# Patient Record
Sex: Male | Born: 1985 | Race: White | Hispanic: No | Marital: Married | Smoking: Current some day smoker
Health system: Southern US, Community
[De-identification: ages and names within clinical notes are randomized; demographics above are authoritative.]

## PROBLEM LIST (undated history)

## (undated) DIAGNOSIS — G43909 Migraine, unspecified, not intractable, without status migrainosus: Secondary | ICD-10-CM

## (undated) HISTORY — PX: VASECTOMY: SHX75

---

## 2009-11-01 ENCOUNTER — Emergency Department: Payer: Self-pay | Admitting: Emergency Medicine

## 2009-11-02 ENCOUNTER — Emergency Department: Payer: Self-pay | Admitting: Emergency Medicine

## 2010-06-09 IMAGING — CR RIGHT HAND - COMPLETE 3+ VIEW
1 series · 3 of 3 positions shown · non-contrast
Comparison: none

REASON FOR EXAM: hand pain s/p punching wall
COMMENTS:

PROCEDURE:     DXR - DXR HAND RT COMPLETE W/OBLIQUES  - November 02, 2009  [DATE]
RESULT:     History: And pain, comparison: None

[Series 1: view not recorded · 0.17mm/px · 3 of 3 slices shown]
[im 1/3]
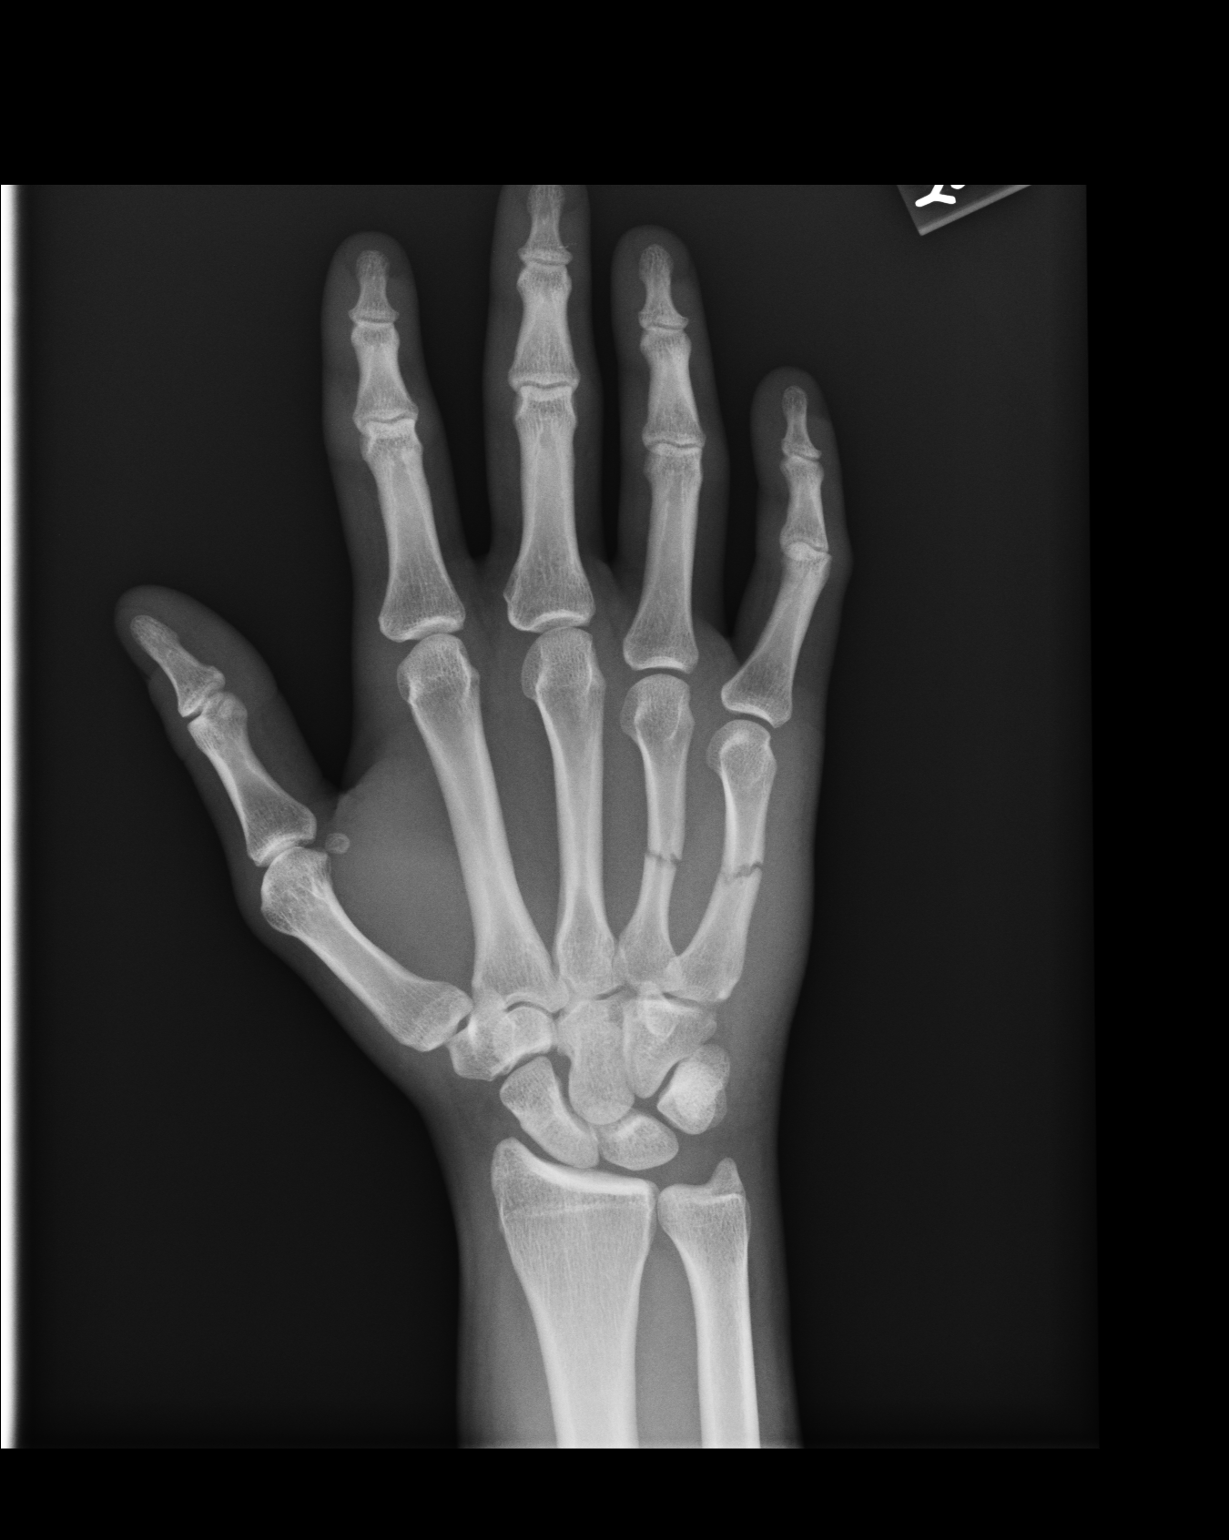
[im 2/3]
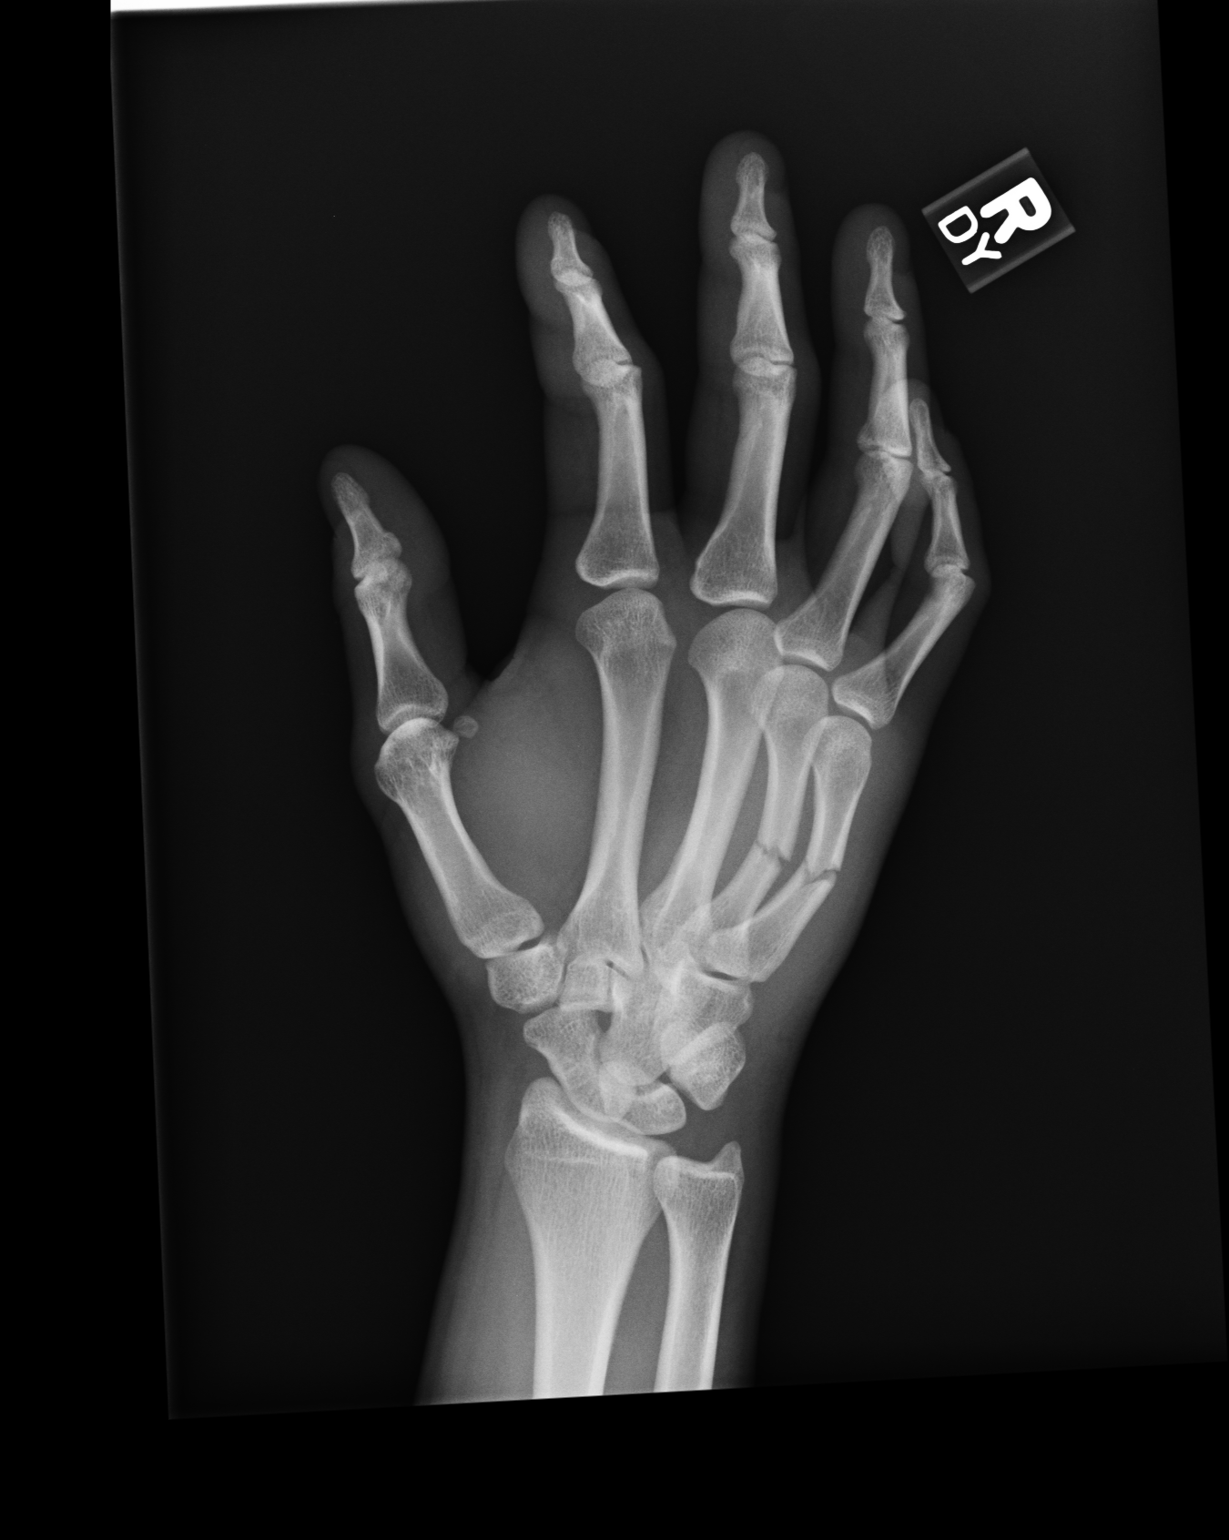
[im 3/3]
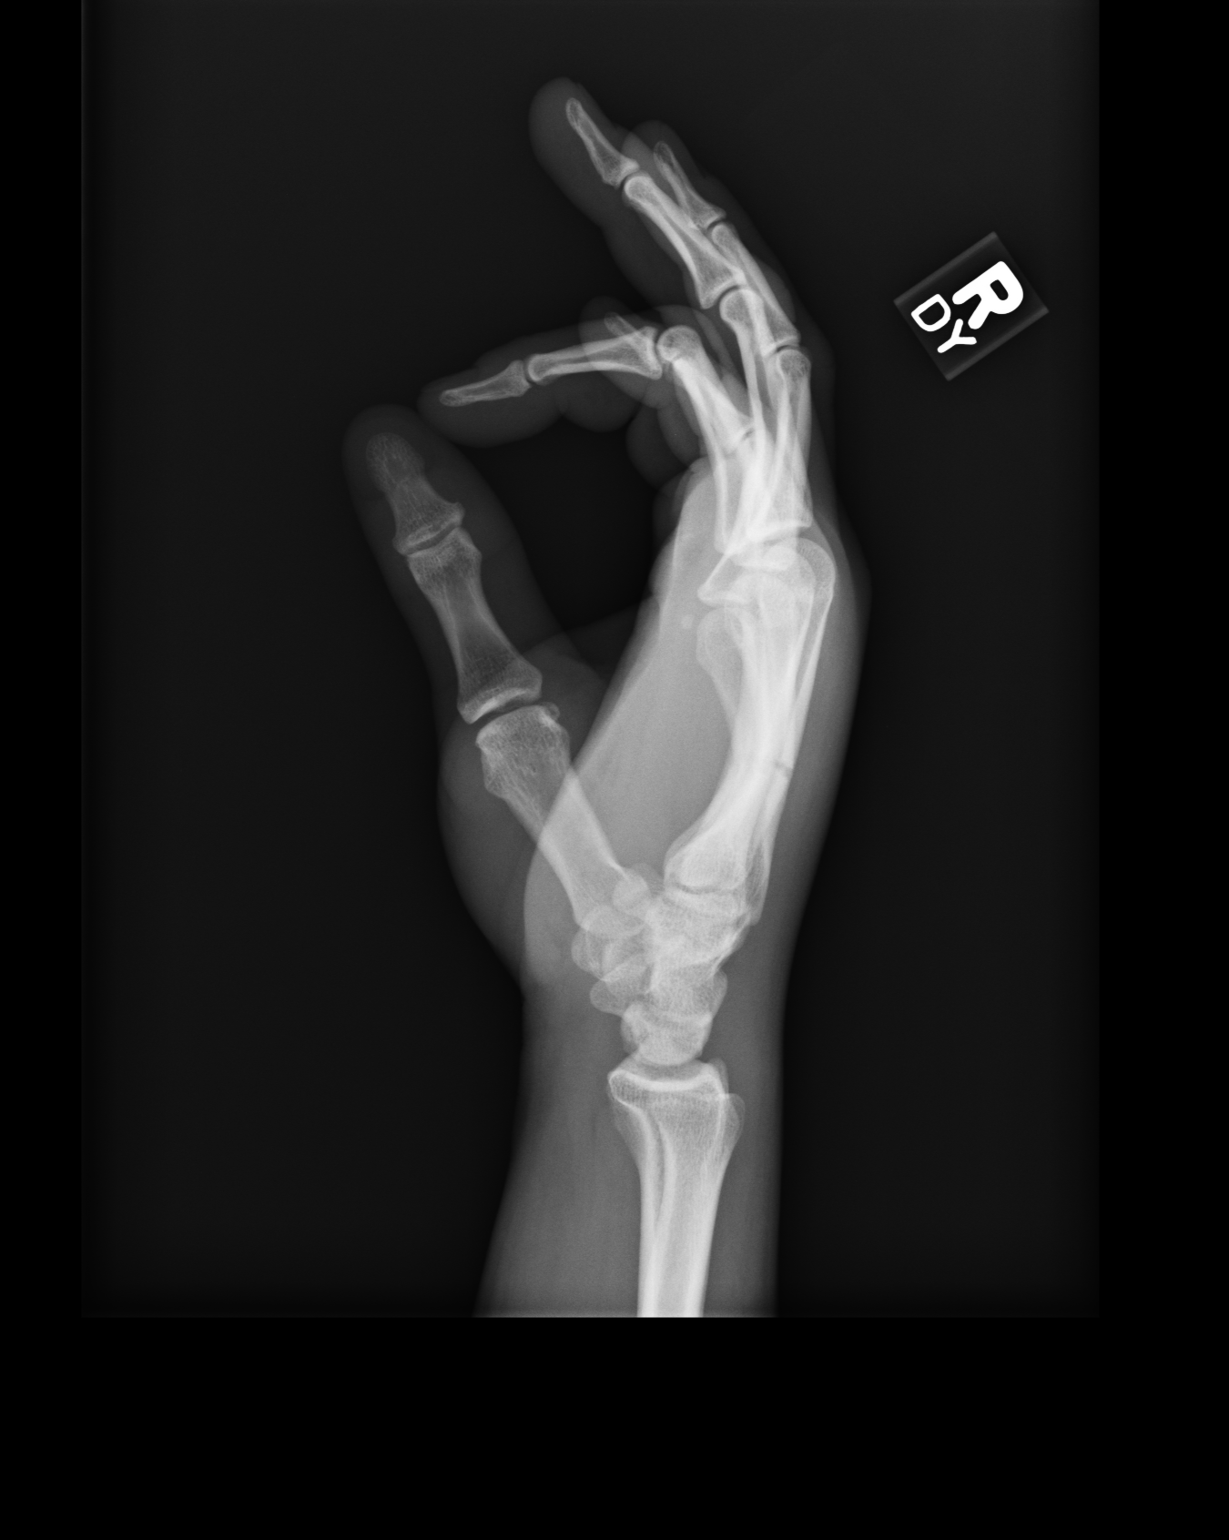

[3 of 3 positions shown; findings below may reference images not displayed]

FINDINGS: Three views of the right hand are provided. There is a transverse
nondisplaced fracture of the middiaphysis of the right fourth metacarpal.
There is a nondisplaced transverse fracture of the middiaphysis of the right
fifth metacarpal with mild apex dorsal angulation. No other fractures are
identified. There is no dislocation. There is mild overlying soft tissue
swelling.
IMPRESSION: 1. There is a transverse nondisplaced fracture of the middiaphysis of the
right fourth metacarpal.

2. There is a nondisplaced transverse fracture of the middiaphysis of the
right fifth metacarpal with mild apex dorsal angulation.

## 2011-03-12 ENCOUNTER — Emergency Department: Payer: Self-pay | Admitting: Emergency Medicine

## 2011-06-21 ENCOUNTER — Emergency Department: Payer: Self-pay | Admitting: Emergency Medicine

## 2011-08-09 ENCOUNTER — Emergency Department: Payer: Self-pay | Admitting: Emergency Medicine

## 2011-08-30 ENCOUNTER — Emergency Department: Payer: Self-pay | Admitting: Emergency Medicine

## 2011-10-06 ENCOUNTER — Emergency Department: Payer: Self-pay | Admitting: Internal Medicine

## 2012-01-26 IMAGING — CR DG SHOULDER 3+V*L*
1 series · 3 of 3 positions shown · non-contrast
Comparison: None

REASON FOR EXAM: pain
COMMENTS:

PROCEDURE:     DXR - DXR SHOULDER LEFT COMPLETE  - June 21, 2011  [DATE]
RESULT:     History: Pain

[Series 1: view not recorded · 0.17mm/px · 3 of 3 slices shown]
[im 1/3]
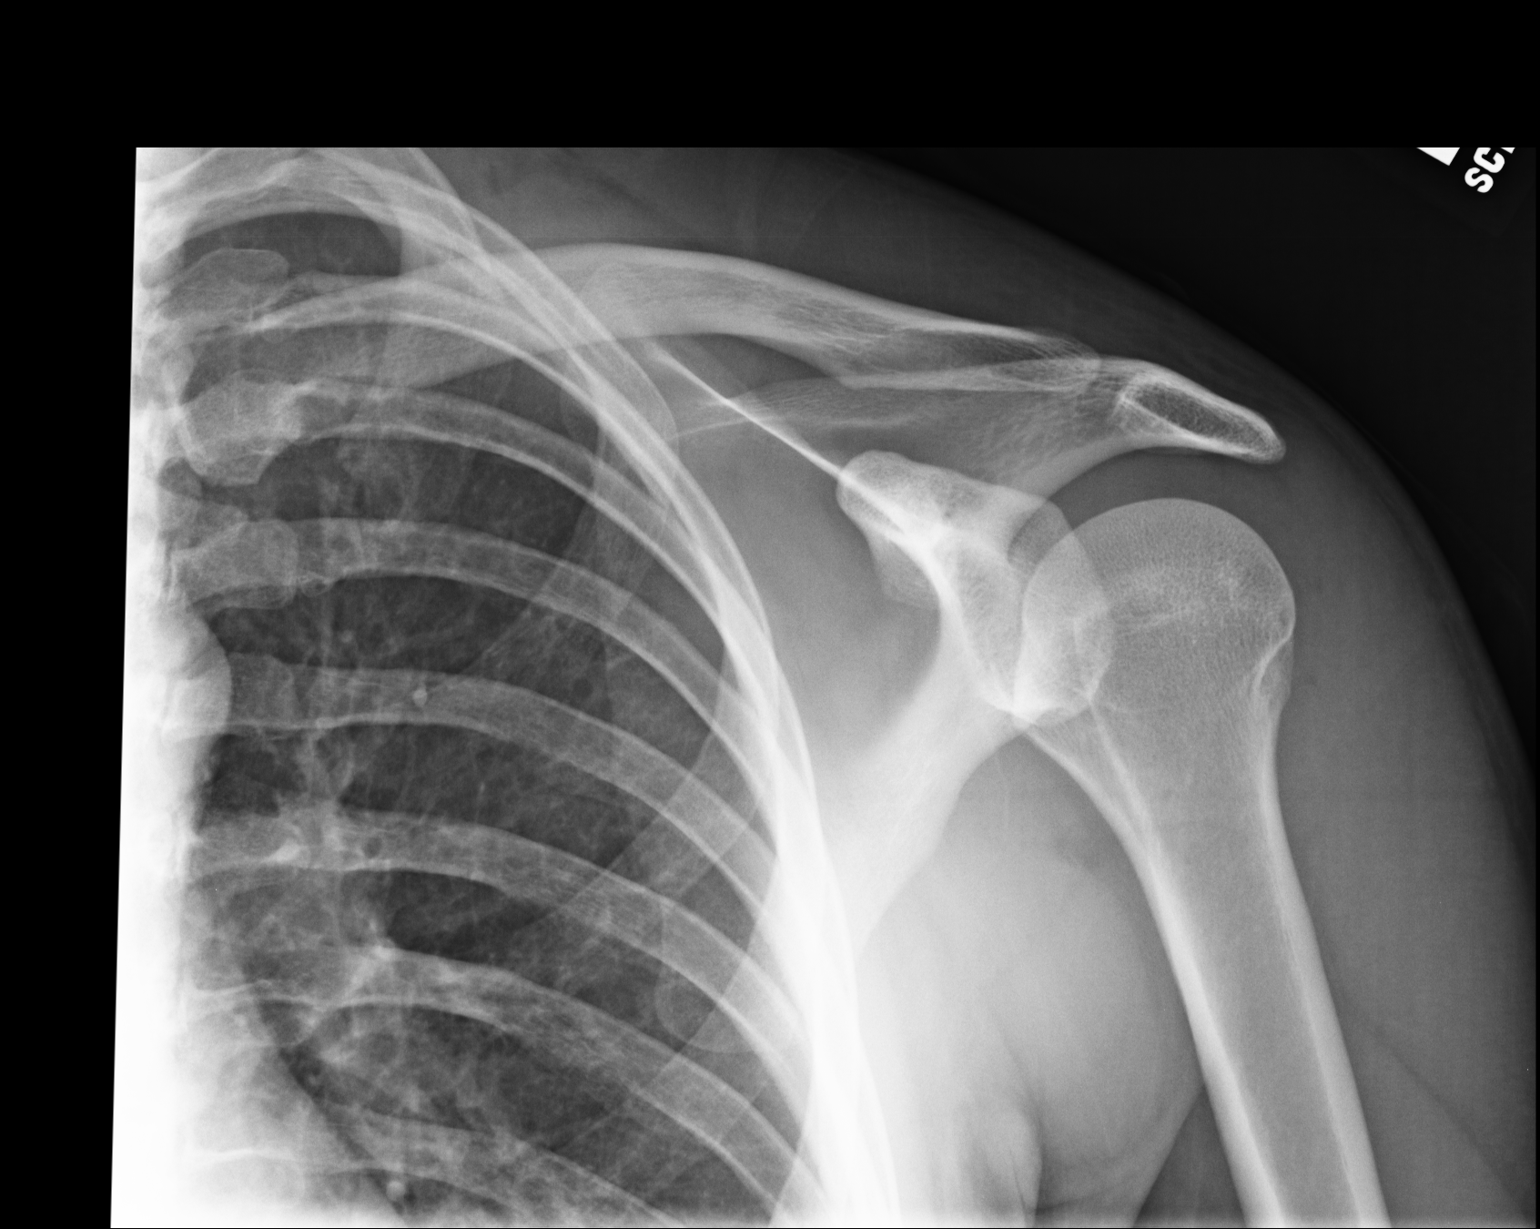
[im 2/3]
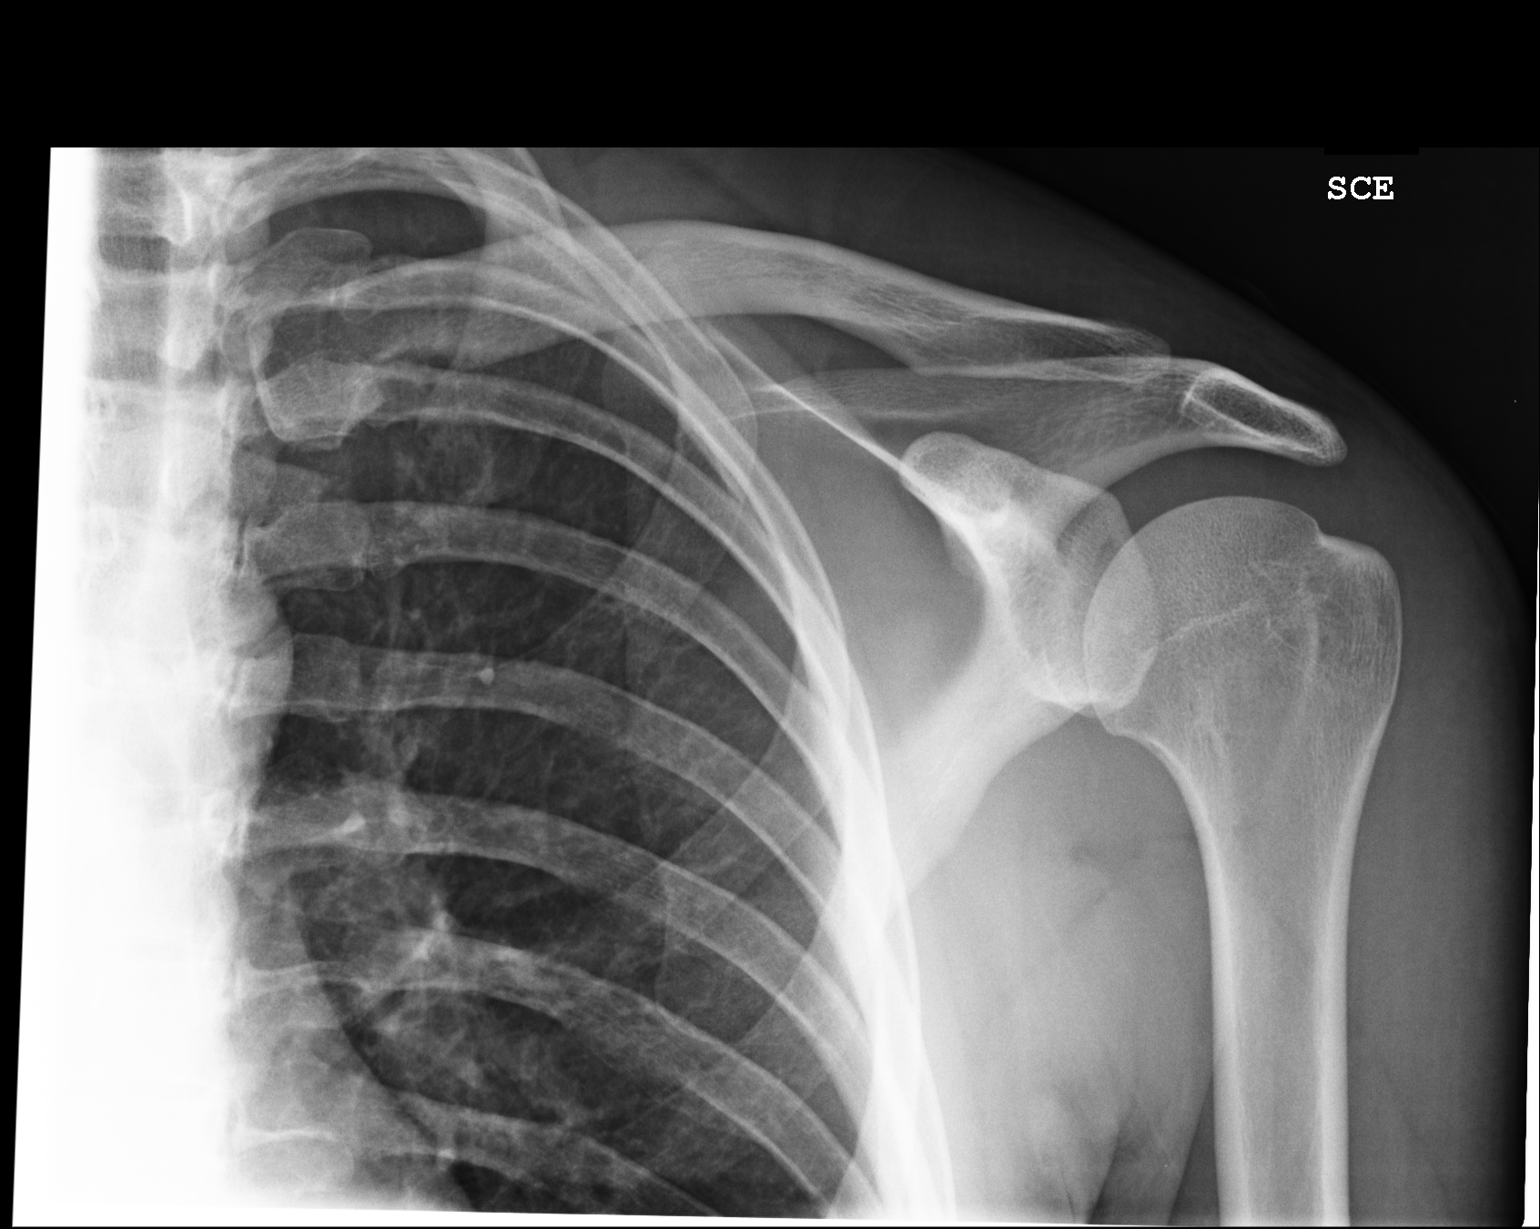
[im 3/3]
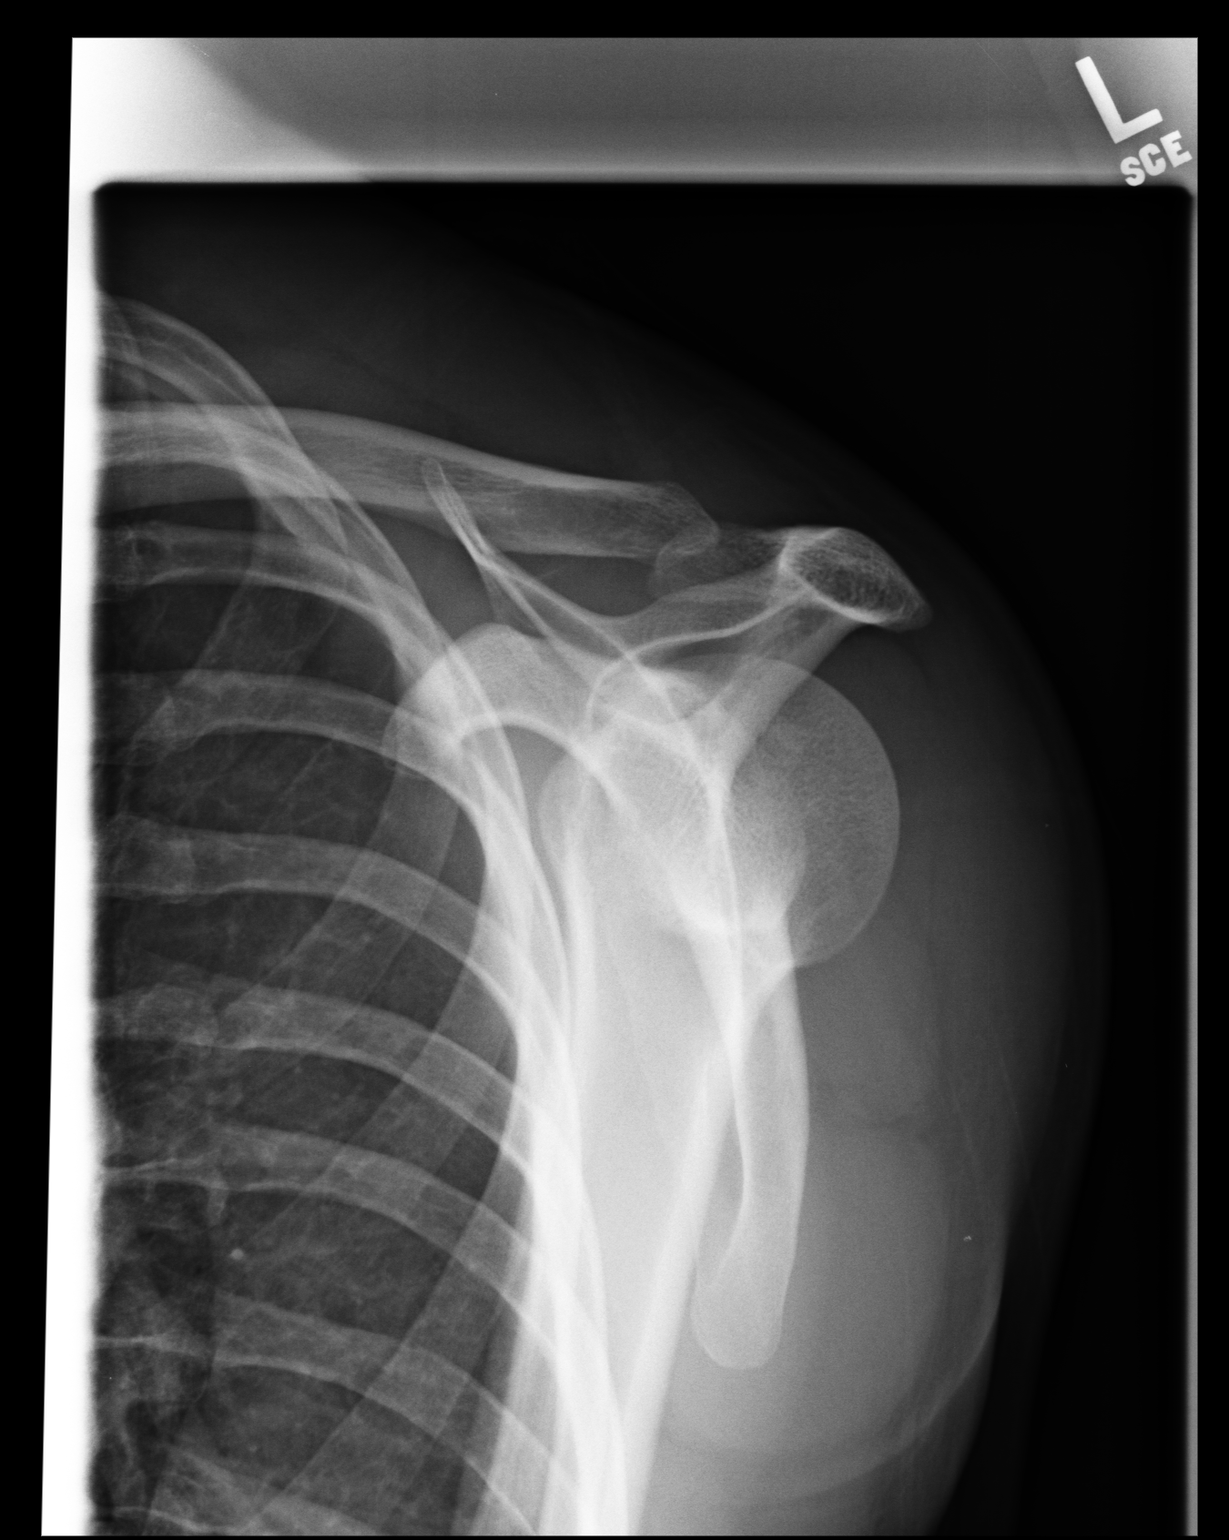

[3 of 3 positions shown; findings below may reference images not displayed]

FINDINGS: 3 views of the left shoulder demonstrates no fracture or dislocation. The
acromioclavicular joint is normal.
IMPRESSION: No acute osseous injury of the left shoulder.

## 2012-04-05 ENCOUNTER — Emergency Department: Payer: Self-pay | Admitting: Emergency Medicine

## 2013-01-08 ENCOUNTER — Emergency Department: Payer: Self-pay | Admitting: Emergency Medicine

## 2014-06-05 ENCOUNTER — Emergency Department: Payer: Self-pay | Admitting: Emergency Medicine

## 2014-06-05 LAB — CBC
HCT: 50.4 % (ref 40.0–52.0)
HGB: 17.1 g/dL (ref 13.0–18.0)
MCH: 31.7 pg (ref 26.0–34.0)
MCHC: 33.9 g/dL (ref 32.0–36.0)
MCV: 94 fL (ref 80–100)
PLATELETS: 202 10*3/uL (ref 150–440)
RBC: 5.39 10*6/uL (ref 4.40–5.90)
RDW: 12.1 % (ref 11.5–14.5)
WBC: 8.5 10*3/uL (ref 3.8–10.6)

## 2014-06-05 LAB — URINALYSIS, COMPLETE
BILIRUBIN, UR: NEGATIVE
Bacteria: NONE SEEN
Blood: NEGATIVE
Glucose,UR: NEGATIVE mg/dL (ref 0–75)
Hyaline Cast: 3
KETONE: NEGATIVE
Leukocyte Esterase: NEGATIVE
NITRITE: NEGATIVE
PH: 6 (ref 4.5–8.0)
Protein: 30
RBC,UR: 1 /HPF (ref 0–5)
SPECIFIC GRAVITY: 1.011 (ref 1.003–1.030)
Squamous Epithelial: NONE SEEN
WBC UR: 5 /HPF (ref 0–5)

## 2014-06-05 LAB — COMPREHENSIVE METABOLIC PANEL
ANION GAP: 6 — AB (ref 7–16)
Albumin: 4.1 g/dL (ref 3.4–5.0)
Alkaline Phosphatase: 85 U/L
BILIRUBIN TOTAL: 0.4 mg/dL (ref 0.2–1.0)
BUN: 12 mg/dL (ref 7–18)
CALCIUM: 8.9 mg/dL (ref 8.5–10.1)
CHLORIDE: 105 mmol/L (ref 98–107)
CREATININE: 1 mg/dL (ref 0.60–1.30)
Co2: 32 mmol/L (ref 21–32)
EGFR (African American): >60
Glucose: 106 mg/dL — ABNORMAL HIGH (ref 65–99)
Osmolality: 285 (ref 275–301)
Potassium: 3.8 mmol/L (ref 3.5–5.1)
SGOT(AST): 24 U/L (ref 15–37)
SGPT (ALT): 20 U/L
SODIUM: 143 mmol/L (ref 136–145)
TOTAL PROTEIN: 7.4 g/dL (ref 6.4–8.2)

## 2015-01-10 IMAGING — CT CT STONE STUDY
3 of 4 series · 5 of 16 positions shown, 6 images · non-contrast
Comparison: None.

CLINICAL DATA: Right flank pain and dysuria

EXAM:
CT ABDOMEN AND PELVIS WITHOUT CONTRAST
TECHNIQUE: Multidetector CT imaging of the abdomen and pelvis was performed
following the standard protocol without IV contrast.

[Series 4: lung · axial · 0.72mm/px · z∈[-636,-636]mm · 1 of 27 slices shown, 2 images]
[im 1/27  soft-tissue]
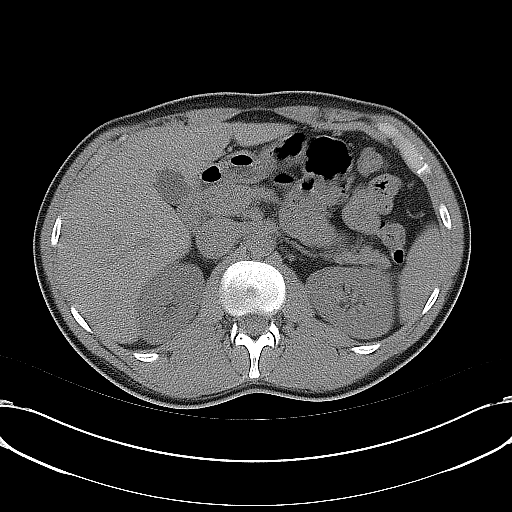
[im 1/27  bone]
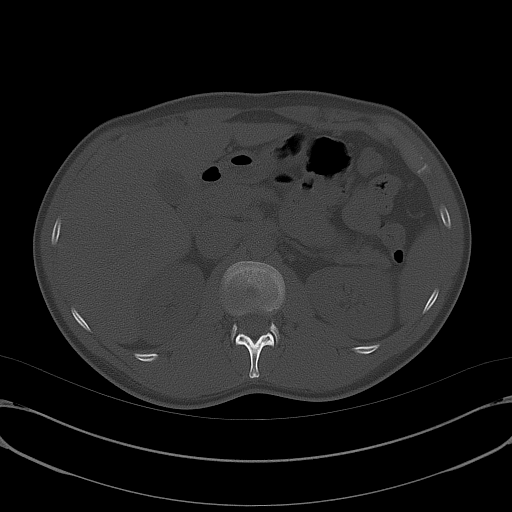

[Series 5: coronal · coronal · 0.81mm/px · 3 of 130 slices shown]
[im 33/130  soft-tissue]
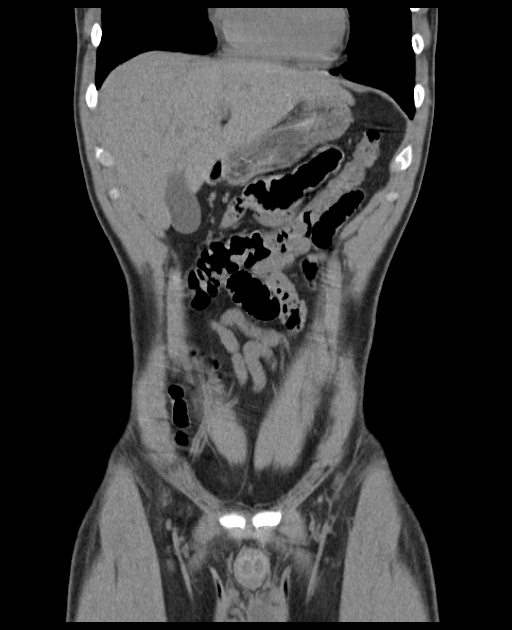
[im 65/130  soft-tissue]
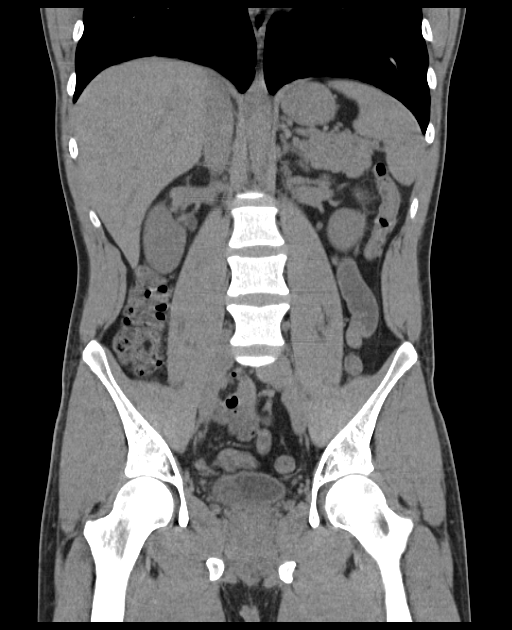
[im 97/130  soft-tissue]
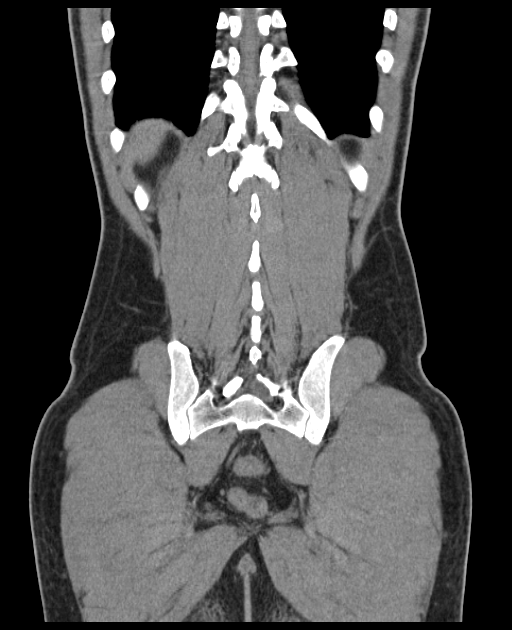

[Series 6: sagittal · sagittal · 0.90mm/px · 1 of 173 slices shown]
[im 69/173  soft-tissue]
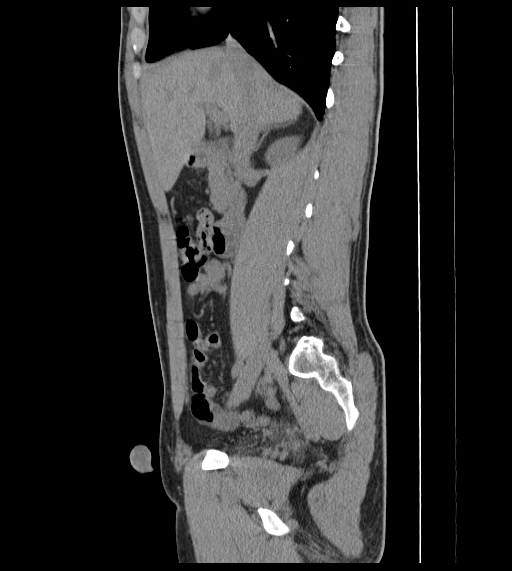

[5 of 16 positions shown; findings below may reference images not displayed]

FINDINGS: BODY WALL: Unremarkable.

LOWER CHEST: Unremarkable.

ABDOMEN/PELVIS:

Liver: No focal abnormality.

Biliary: No evidence of biliary obstruction or stone.

Pancreas: Unremarkable.

Spleen: Unremarkable.

Adrenals: Unremarkable.

Kidneys and ureters: 3 mm calculus at the right ureteral vesicular
junction. There is mild right hydronephrosis and hydroureter.
Probable developing calculus in the upper and lower poles of the
right kidney.

Bladder: Haziness in the right posterior bladder could reflect
submucosal edema or layering hemorrhage.

Reproductive: Unremarkable.

Bowel: No obstruction. Normal appendix.

Retroperitoneum: No mass or adenopathy.

Peritoneum: No ascites or pneumoperitoneum.

Vascular: No acute abnormality.

OSSEOUS: No acute abnormalities.
IMPRESSION: 3 mm stone at the right UVJ with mild urinary obstruction.

## 2015-12-27 ENCOUNTER — Emergency Department: Payer: Self-pay

## 2015-12-27 ENCOUNTER — Emergency Department
Admission: EM | Admit: 2015-12-27 | Discharge: 2015-12-27 | Disposition: A | Discharge status: Home / Self Care | Payer: Self-pay | Attending: Emergency Medicine | Admitting: Emergency Medicine

## 2015-12-27 DIAGNOSIS — Y9364 Activity, baseball: Secondary | ICD-10-CM | POA: Insufficient documentation

## 2015-12-27 DIAGNOSIS — Y92320 Baseball field as the place of occurrence of the external cause: Secondary | ICD-10-CM | POA: Insufficient documentation

## 2015-12-27 DIAGNOSIS — Y998 Other external cause status: Secondary | ICD-10-CM | POA: Insufficient documentation

## 2015-12-27 DIAGNOSIS — X58XXXA Exposure to other specified factors, initial encounter: Secondary | ICD-10-CM | POA: Insufficient documentation

## 2015-12-27 DIAGNOSIS — M2392 Unspecified internal derangement of left knee: Secondary | ICD-10-CM

## 2015-12-27 DIAGNOSIS — S83207A Unspecified tear of unspecified meniscus, current injury, left knee, initial encounter: Secondary | ICD-10-CM | POA: Insufficient documentation

## 2015-12-27 DIAGNOSIS — F17210 Nicotine dependence, cigarettes, uncomplicated: Secondary | ICD-10-CM | POA: Insufficient documentation

## 2015-12-27 MED ORDER — HYDROCODONE-ACETAMINOPHEN 5-325 MG PO TABS: 1.0000 | ORAL_TABLET | ORAL | Status: DC | PRN

## 2015-12-27 NOTE — ED Notes (Signed)
Discussed discharge instructions, prescriptions, and follow-up care with patient. No questions or concerns at this time. Pt stable at discharge.  

## 2015-12-27 NOTE — ED Provider Notes (Signed)
[facility] Emergency Department Provider Note ____________________________________________  Time seen: Approximately 3:05 PM  I have reviewed the triage vital signs and the nursing notes.   HISTORY  Chief Complaint Knee Injury   HPI Jerry Henson is a 30 y.o. male is here complaining of left knee pain. Patient injured his knee while jumping up for a baseball yesterday and came down with his leg straight and felt like his knee went backwards. He states that the pain became worse overnight and he has taken ibuprofen and Tylenol with some relief. He still continues to have swelling and decreased range of motion secondary to the swelling and pain. He denies any previous injury to his knee. Currently his pain is an 8 out of 10 however he does not want to take any pain medication at this time.   History reviewed. No pertinent past medical history.  There are no active problems to display for this patient.   History reviewed. No pertinent past surgical history.  Current Outpatient Rx  Name  Route  Sig  Dispense  Refill  . HYDROcodone-acetaminophen (NORCO/VICODIN) 5-325 MG tablet   Oral   Take 1 tablet by mouth every 4 (four) hours as needed for moderate pain.   20 tablet   0     Allergies Review of patient's allergies indicates no known allergies.  No family history on file.  Social History Social History  Substance Use Topics  . Smoking status: Current Every Day Smoker -- 1.00 packs/day    Types: Cigarettes  . Smokeless tobacco: None  . Alcohol Use: 1.2 oz/week    2 Cans of beer per week    Review of Systems Constitutional: No fever/chills ENT: No trauma Cardiovascular: Denies chest pain. Respiratory: Denies shortness of breath. Gastrointestinal:  No nausea, no vomiting.  Musculoskeletal: Positive left knee pain Skin: Negative for rash. Neurological: Negative for  focal weakness or numbness.  10-point ROS otherwise  negative.  ____________________________________________   PHYSICAL EXAM:  VITAL SIGNS: ED Triage Vitals  Enc Vitals Group     BP 12/27/15 1422 135/85 mmHg     Pulse Rate 12/27/15 1422 73     Resp 12/27/15 1422 18     Temp 12/27/15 1422 98.2 F (36.8 C)     Temp Source 12/27/15 1422 Oral     SpO2 12/27/15 1422 99 %     Weight 12/27/15 1422 185 lb (83.915 kg)     Height 12/27/15 1422 5\' 9"  (1.753 m)     Head Cir --      Peak Flow --      Pain Score 12/27/15 1423 8     Pain Loc --      Pain Edu? --      Excl. in GC? --     Constitutional: Alert and oriented. Well appearing and in no acute distress. Eyes: Conjunctivae are normal. PERRL. EOMI. Head: Atraumatic. Nose: No congestion/rhinnorhea. Neck: No stridor.   Cardiovascular: Normal rate, regular rhythm. Grossly normal heart sounds.  Good peripheral circulation. Respiratory: Normal respiratory effort.  No retractions. Lungs CTAB. Musculoskeletal: There is moderate tenderness on palpation of the anterior left knee. There is no gross deformity however there is some effusion present. Range of motion is restricted secondary to patient's pain and therefore ligaments weren't also not tested secondary to pain. Neurologic:  Normal speech and language. No gross focal neurologic deficits are appreciated. No gait instability. Skin:  Skin is warm, dry and intact. No erythema, abrasions, or ecchymosis  noted. Psychiatric: Mood and affect are normal. Speech and behavior are normal.  ____________________________________________   LABS (all labs ordered are listed, but only abnormal results are displayed)  Labs Reviewed - No data to display   RADIOLOGY  Left knee per radiologist shows moderate size joint effusion without definite fracture. I, Tommi Rumps, personally viewed and evaluated these images (plain radiographs) as part of my medical decision making, as well as reviewing the written report by the  radiologist. ____________________________________________   PROCEDURES  Procedure(s) performed: None  Critical Care performed: No  ____________________________________________   INITIAL IMPRESSION / ASSESSMENT AND PLAN / ED COURSE  Pertinent labs & imaging results that were available during my care of the patient were reviewed by me dered in my medical decision making (see chart for details).  Patient was discharged in knee immobilizer and crutches. He is also encouraged to ice and elevate his knee to reduce pain and swelling. He she will continue taking ibuprofen for pain and inflammation but was also given a prescription for Norco as needed for severe pain. He is encouraged to follow-up with Dr. Ernest Pine if any continued problems with his knee and a note for work was given as well. ____________________________________________   FINAL CLINICAL IMPRESSION(S) / ED DIAGNOSES  Final diagnoses:  Derangement of left knee      Tommi Rumps, PA-C 12/27/15 1713  Emily Filbert, MD 12/27/15 1721

## 2015-12-27 NOTE — ED Notes (Signed)
Pt reports to ED w/ c/o L knee injury.  Pt sts that he landed wrong after jumping for ball yesterday.  Pt sts injury/pain got worse overnight.  NAD.  Pulses and sensation intact

## 2016-03-21 ENCOUNTER — Encounter: Payer: Self-pay | Admitting: Emergency Medicine

## 2016-03-21 ENCOUNTER — Emergency Department
Admission: EM | Admit: 2016-03-21 | Discharge: 2016-03-21 | Disposition: A | Discharge status: Home / Self Care | Payer: Self-pay | Attending: Emergency Medicine | Admitting: Emergency Medicine

## 2016-03-21 DIAGNOSIS — G44219 Episodic tension-type headache, not intractable: Secondary | ICD-10-CM | POA: Insufficient documentation

## 2016-03-21 DIAGNOSIS — F17210 Nicotine dependence, cigarettes, uncomplicated: Secondary | ICD-10-CM | POA: Insufficient documentation

## 2016-03-21 HISTORY — DX: Migraine, unspecified, not intractable, without status migrainosus: G43.909

## 2016-03-21 LAB — URINALYSIS COMPLETE WITH MICROSCOPIC (ARMC ONLY)
BILIRUBIN URINE: NEGATIVE
Bacteria, UA: NONE SEEN
GLUCOSE, UA: NEGATIVE mg/dL
Hgb urine dipstick: NEGATIVE
KETONES UR: NEGATIVE mg/dL
Leukocytes, UA: NEGATIVE
Nitrite: NEGATIVE
PROTEIN: NEGATIVE mg/dL
RBC / HPF: NONE SEEN RBC/hpf (ref 0–5)
Specific Gravity, Urine: 1.01 (ref 1.005–1.030)
Squamous Epithelial / LPF: NONE SEEN
pH: 7 (ref 5.0–8.0)

## 2016-03-21 LAB — BASIC METABOLIC PANEL
Anion gap: 7 (ref 5–15)
BUN: 14 mg/dL (ref 6–20)
CALCIUM: 9.2 mg/dL (ref 8.9–10.3)
CHLORIDE: 103 mmol/L (ref 101–111)
CO2: 27 mmol/L (ref 22–32)
CREATININE: 0.92 mg/dL (ref 0.61–1.24)
GFR calc Af Amer: >60 mL/min (ref >60)
GFR calc non Af Amer: >60 mL/min (ref >60)
Glucose, Bld: 112 mg/dL — ABNORMAL HIGH (ref 65–99)
Potassium: 3.4 mmol/L — ABNORMAL LOW (ref 3.5–5.1)
SODIUM: 137 mmol/L (ref 135–145)

## 2016-03-21 LAB — CBC WITH DIFFERENTIAL/PLATELET
BASOS PCT: 1 %
Basophils Absolute: 0 10*3/uL (ref 0–0.1)
EOS ABS: 0.6 10*3/uL (ref 0–0.7)
EOS PCT: 7 %
HCT: 43.1 % (ref 40.0–52.0)
Hemoglobin: 14.9 g/dL (ref 13.0–18.0)
LYMPHS ABS: 2.3 10*3/uL (ref 1.0–3.6)
Lymphocytes Relative: 30 %
MCH: 31.3 pg (ref 26.0–34.0)
MCHC: 34.6 g/dL (ref 32.0–36.0)
MCV: 90.5 fL (ref 80.0–100.0)
MONOS PCT: 5 %
Monocytes Absolute: 0.4 10*3/uL (ref 0.2–1.0)
Neutro Abs: 4.5 10*3/uL (ref 1.4–6.5)
Neutrophils Relative %: 57 %
PLATELETS: 174 10*3/uL (ref 150–440)
RBC: 4.76 MIL/uL (ref 4.40–5.90)
RDW: 12.5 % (ref 11.5–14.5)
WBC: 7.8 10*3/uL (ref 3.8–10.6)

## 2016-03-21 MED ORDER — SODIUM CHLORIDE 0.9 % IV BOLUS (SEPSIS)
1000.0000 mL | Freq: Once | INTRAVENOUS | Status: AC
  Administered 2016-03-21: 1000 mL via INTRAVENOUS

## 2016-03-21 MED ORDER — TRAMADOL HCL 50 MG PO TABS
50.0000 mg | ORAL_TABLET | Freq: Once | ORAL | Status: AC
  Administered 2016-03-21: 50 mg via ORAL
  Filled 2016-03-21: qty 1

## 2016-03-21 MED ORDER — BUTALBITAL-APAP-CAFFEINE 50-325-40 MG PO TABS: 1.0000 | ORAL_TABLET | Freq: Four times a day (QID) | ORAL | Status: AC | PRN

## 2016-03-21 MED ORDER — KETOROLAC TROMETHAMINE 30 MG/ML IJ SOLN
30.0000 mg | Freq: Once | INTRAMUSCULAR | Status: AC
  Administered 2016-03-21: 30 mg via INTRAVENOUS
  Filled 2016-03-21: qty 1

## 2016-03-21 NOTE — ED Provider Notes (Signed)
[facility] Emergency Department Provider Note   ____________________________________________  Time seen: Approximately 4:32 PM  I have reviewed the triage vital signs and the nursing notes.   HISTORY  Chief Complaint Migraine    HPI Jerry Henson is a 30 y.o. male patient complaining of 1 month of intermittent headaches patient is a headaches, blurry vision and weakness. Patient denies any nausea vomiting associated these headaches. Patient state he does feel dehydrated. Patient states this trauma relieved by taking ibuprofen. Patient denies any visual or auditory aura  with these headaches but state they come on gradually.Patient also states that he removed to ticks in the past week from his left hip. Patient denies any rash from the tick bite denies any arthralgia. Patient denies any fever or chills associated with this complaint. No other palliative measures for this complaint. Patient denies this is the "worst headache of his life". Stated headache pain is the same with each occurrence. Past Medical History  Diagnosis Date  . Migraines     There are no active problems to display for this patient.   History reviewed. No pertinent past surgical history.  Current Outpatient Rx  Name  Route  Sig  Dispense  Refill  . butalbital-acetaminophen-caffeine (FIORICET) 50-325-40 MG tablet   Oral   Take 1-2 tablets by mouth every 6 (six) hours as needed for headache.   20 tablet   2   . HYDROcodone-acetaminophen (NORCO/VICODIN) 5-325 MG tablet   Oral   Take 1 tablet by mouth every 4 (four) hours as needed for moderate pain.   20 tablet   0     Allergies Review of patient's allergies indicates no known allergies.  No family history on file.  Social History Social History  Substance Use Topics  . Smoking status: Current Every Day Smoker -- 1.00 packs/day    Types: Cigarettes  . Smokeless tobacco: None  . Alcohol Use: 1.2 oz/week    2 Cans of  beer per week    Review of Systems Constitutional: No fever/chills Eyes:Intermittent blurry vision  ENT: No sore throat. Cardiovascular: Denies chest pain. Respiratory: Denies shortness of breath. Gastrointestinal: No abdominal pain.  No nausea, no vomiting.  No diarrhea.  No constipation. Genitourinary: Negative for dysuria. Musculoskeletal: Negative for back pain. Skin: Negative for rash. Neurological: Positive for headaches, denies focal weakness or numbness.   ____________________________________________   PHYSICAL EXAM:  VITAL SIGNS: ED Triage Vitals  Enc Vitals Group     BP 03/21/16 1606 134/80 mmHg     Pulse Rate 03/21/16 1606 59     Resp 03/21/16 1606 20     Temp 03/21/16 1606 98.4 F (36.9 C)     Temp Source 03/21/16 1606 Oral     SpO2 03/21/16 1606 100 %     Weight 03/21/16 1606 180 lb (81.647 kg)     Height 03/21/16 1606  (1.753 m)     Head Cir --      Peak Flow --      Pain Score 03/21/16 1608 8     Pain Loc --      Pain Edu? --      Excl. in GC? --     Constitutional: Alert and oriented. Well appearing and in no acute distress. Eyes: Conjunctivae are normal. PERRL. EOMI. Head: Atraumatic. Nose: No congestion/rhinnorhea. Mouth/Throat: Mucous membranes are moist.  Oropharynx non-erythematous. Neck: No stridor.  No cervical spine tenderness to palpation. Hematological/Lymphatic/Immunilogical: No cervical lymphadenopathy. Cardiovascular: Normal rate,  regular rhythm. Grossly normal heart sounds.  Good peripheral circulation.Bradycardic Respiratory: Normal respiratory effort.  No retractions. Lungs CTAB. Gastrointestinal: Soft and nontender. No distention. No abdominal bruits. No CVA tenderness. Musculoskeletal: No lower extremity tenderness nor edema.  No joint effusions. Neurologic:  Normal speech and language. No gross focal neurologic deficits are appreciated. No gait instability. Skin:  Skin is warm, dry and intact. No rash noted. Psychiatric:  Mood and affect are normal. Speech and behavior are normal.  ____________________________________________   LABS (all labs ordered are listed, but only abnormal results are displayed)  Labs Reviewed  BASIC METABOLIC PANEL - Abnormal; Notable for the following:    Potassium 3.4 (*)    Glucose, Bld 112 (*)    All other components within normal limits  URINALYSIS COMPLETEWITH MICROSCOPIC ([facility] ONLY) - Abnormal; Notable for the following:    Color, Urine YELLOW (*)    APPearance HAZY (*)    All other components within normal limits  CBC WITH DIFFERENTIAL/PLATELET   ____________________________________________  EKG   ____________________________________________  RADIOLOGY   ____________________________________________   PROCEDURES  Procedure(s) performed: None  Critical Care performed: No  ____________________________________________   INITIAL IMPRESSION / ASSESSMENT AND PLAN / ED COURSE  Pertinent labs & imaging results that were available during my care of the patient were reviewed by me and considered in my medical decision making (see chart for details).  Tension headache. Patient state headaches, 80% resolved. Patient given discharge Instructions. Patient advised to follow-up with "clinic if condition persists. ____________________________________________   FINAL CLINICAL IMPRESSION(S) / ED DIAGNOSES  Final diagnoses:  Episodic tension-type headache, not intractable      NEW MEDICATIONS STARTED DURING THIS VISIT:  New Prescriptions   BUTALBITAL-ACETAMINOPHEN-CAFFEINE (FIORICET) 50-325-40 MG TABLET    Take 1-2 tablets by mouth every 6 (six) hours as needed for headache.     Note:  This document was prepared using Dragon voice recognition software and may include unintentional dictation errors.    Joni Reiningonald K Chaos Carlile, PA-C 03/21/16 1816  Emily FilbertJonathan E Williams, MD 03/21/16 [phone]

## 2016-03-21 NOTE — Discharge Instructions (Signed)
Tension Headache A tension headache is pain, pressure, or aching that is felt over the front and sides of your head. These headaches can last from 30 minutes to several days. HOME CARE Managing Pain  Take over-the-counter and prescription medicines only as told by your doctor.  Lie down in a dark, quiet room when you have a headache.  If directed, apply ice to your head and neck area:  Put ice in a plastic bag.  Place a towel between your skin and the bag.  Leave the ice on for 20 minutes, 2-3 times per day.  Use a heating pad or a hot shower to apply heat to your head and neck area as told by your doctor. Eating and Drinking  Eat meals on a regular schedule.  Do not drink a lot of alcohol.  Do not use a lot of caffeine, or stop using caffeine. General Instructions  Keep all follow-up visits as told by your doctor. This is important.  Keep a journal to find out if certain things bring on headaches. For example, write down:  What you eat and drink.  How much sleep you get.  Any change to your diet or medicines.  Try getting a massage, or doing other things that help you to relax.  Lessen stress.  Sit up straight. Do not tighten (tense) your muscles.  Do not use tobacco products. This includes cigarettes, chewing tobacco, or e-cigarettes. If you need help quitting, ask your doctor.  Exercise regularly as told by your doctor.  Get enough sleep. This may mean 7-9 hours of sleep. GET HELP IF:  Your symptoms are not helped by medicine.  You have a headache that feels different from your usual headache.  You feel sick to your stomach (nauseous) or you throw up (vomit).  You have a fever. GET HELP RIGHT AWAY IF:  Your headache becomes very bad.  You keep throwing up.  You have a stiff neck.  You have trouble seeing.  You have trouble speaking.  You have pain in your eye or ear.  Your muscles are weak or you lose muscle control.  You lose your balance  or you have trouble walking.  You feel like you will pass out (faint) or you pass out.  You have confusion.   This information is not intended to replace advice given to you by your health care provider. Make sure you discuss any questions you have with your health care provider.   Document Released: 12/28/2009 Document Revised: 06/24/2015 Document Reviewed: 01/26/2015 Elsevier Interactive Patient Education 2016 [employer]  

## 2016-03-21 NOTE — ED Notes (Signed)
Pt reports he has been having an on and off headache for the past month that causes blurred vision and weakness. Pt denies  Nausea and vomiting but reports he feels as though he is dehydrated. Pt reports relief with ibuprophen but reports headache will return the next morning. Pt is able to walk independently and is in NAD at this time. No neuro deficits noted.

## 2016-03-21 NOTE — ED Notes (Signed)
Migraine headache started L sided. History of migraines. Also complains having removed 2 ticks in past week and worried about consequences.

## 2016-08-02 IMAGING — DX DG KNEE COMPLETE 4+V*L*
4 series · 4 of 4 positions shown · non-contrast
Comparison: None available.

CLINICAL DATA: Injury while playing softball yesterday. Unable to
extend knee. Initial encounter.

EXAM:
LEFT KNEE - COMPLETE 4+ VIEW

[knee ap]
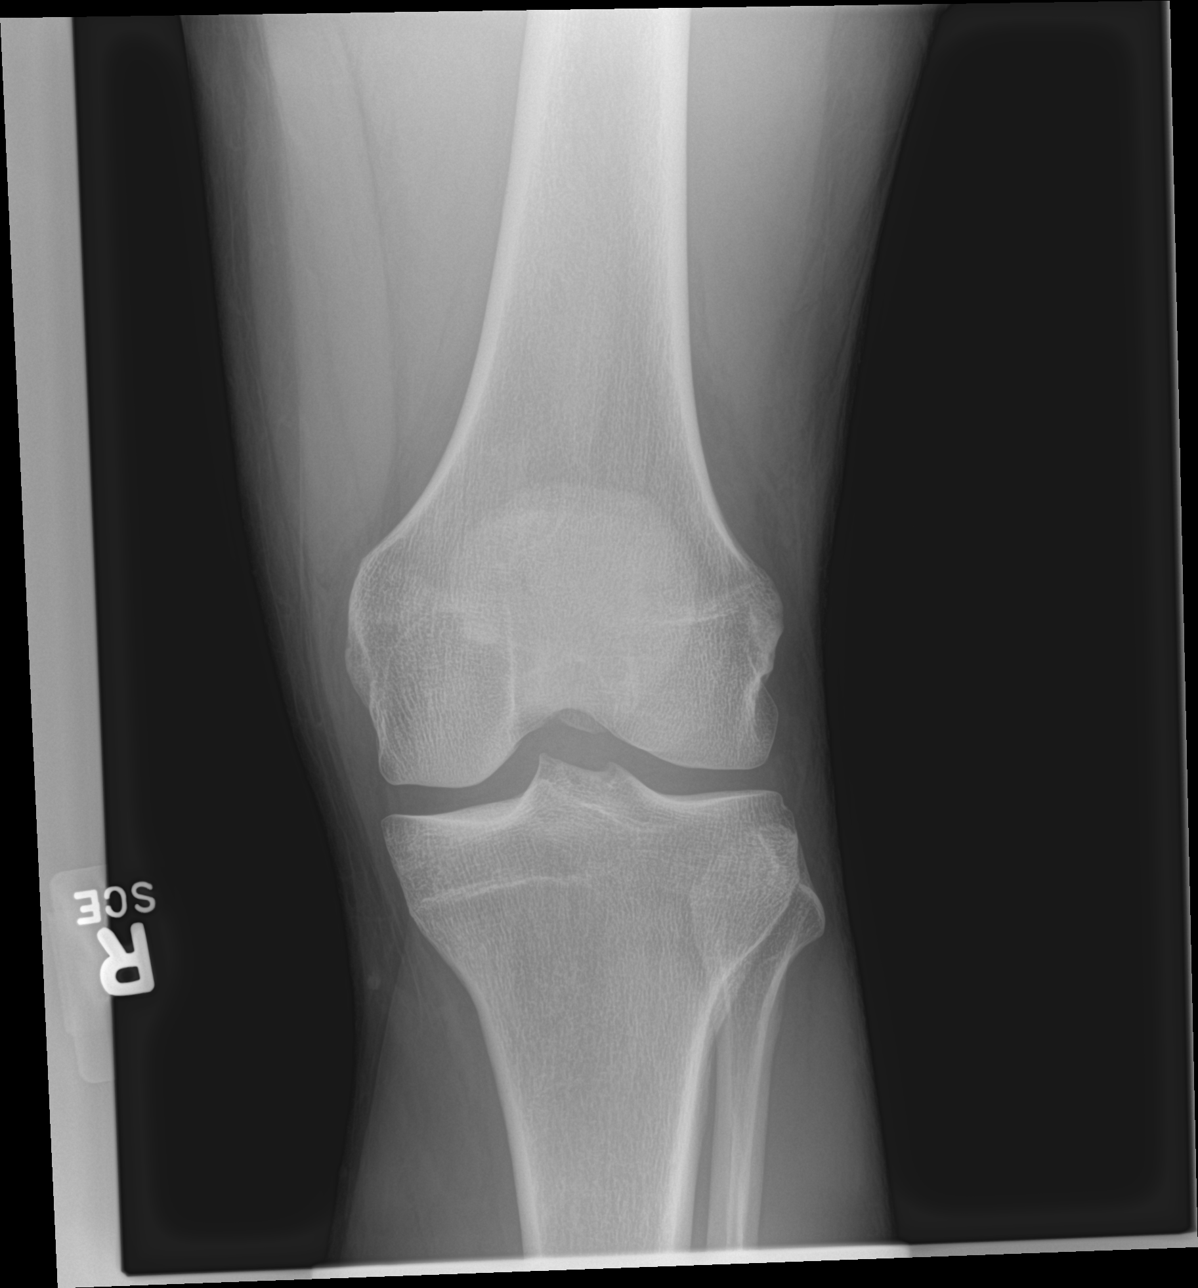

[knee tunnel]
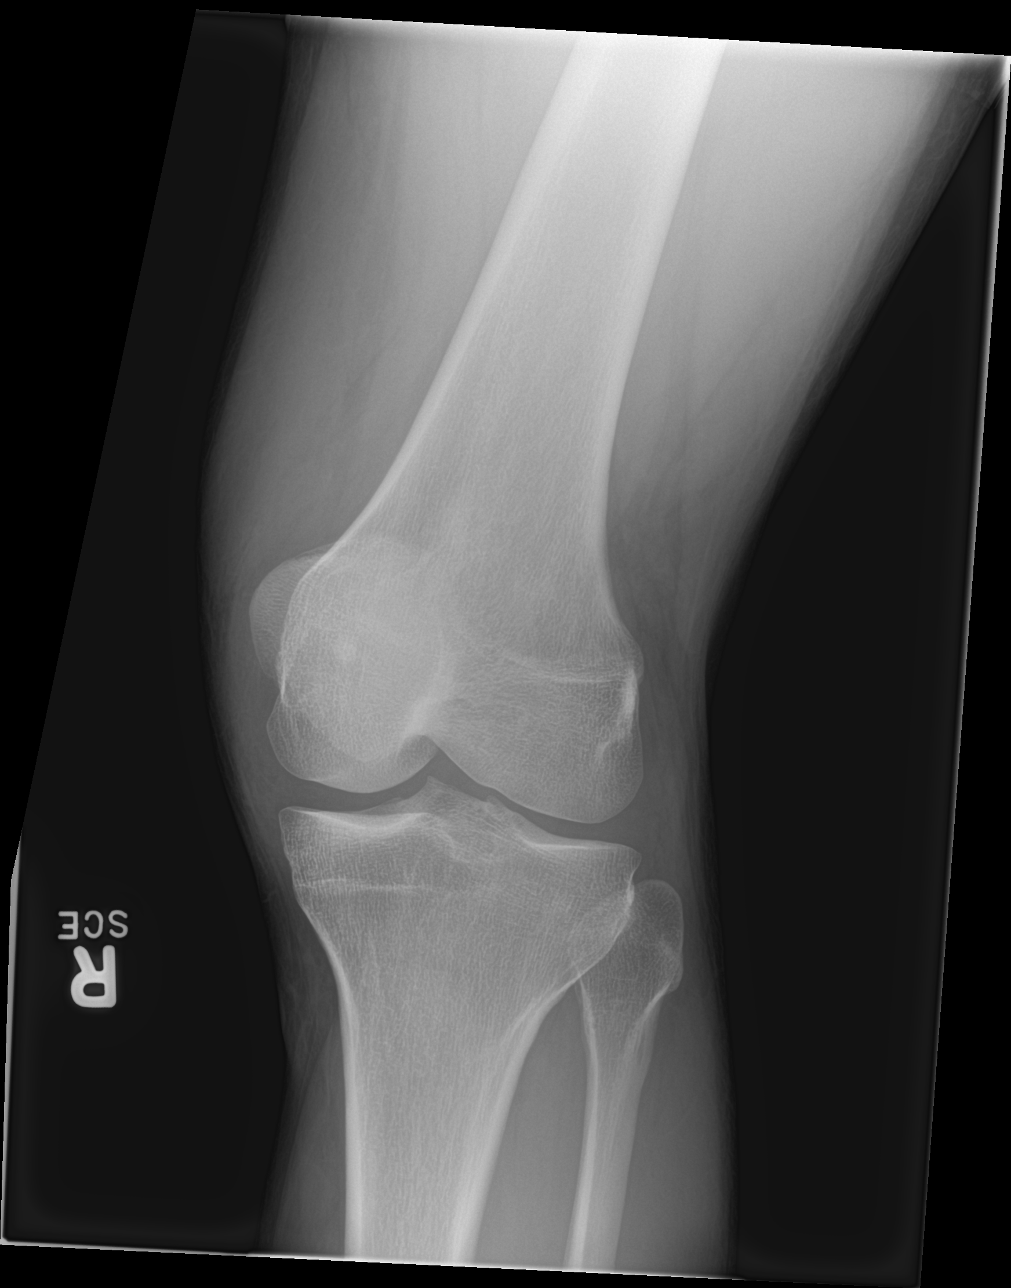

[knee lat]
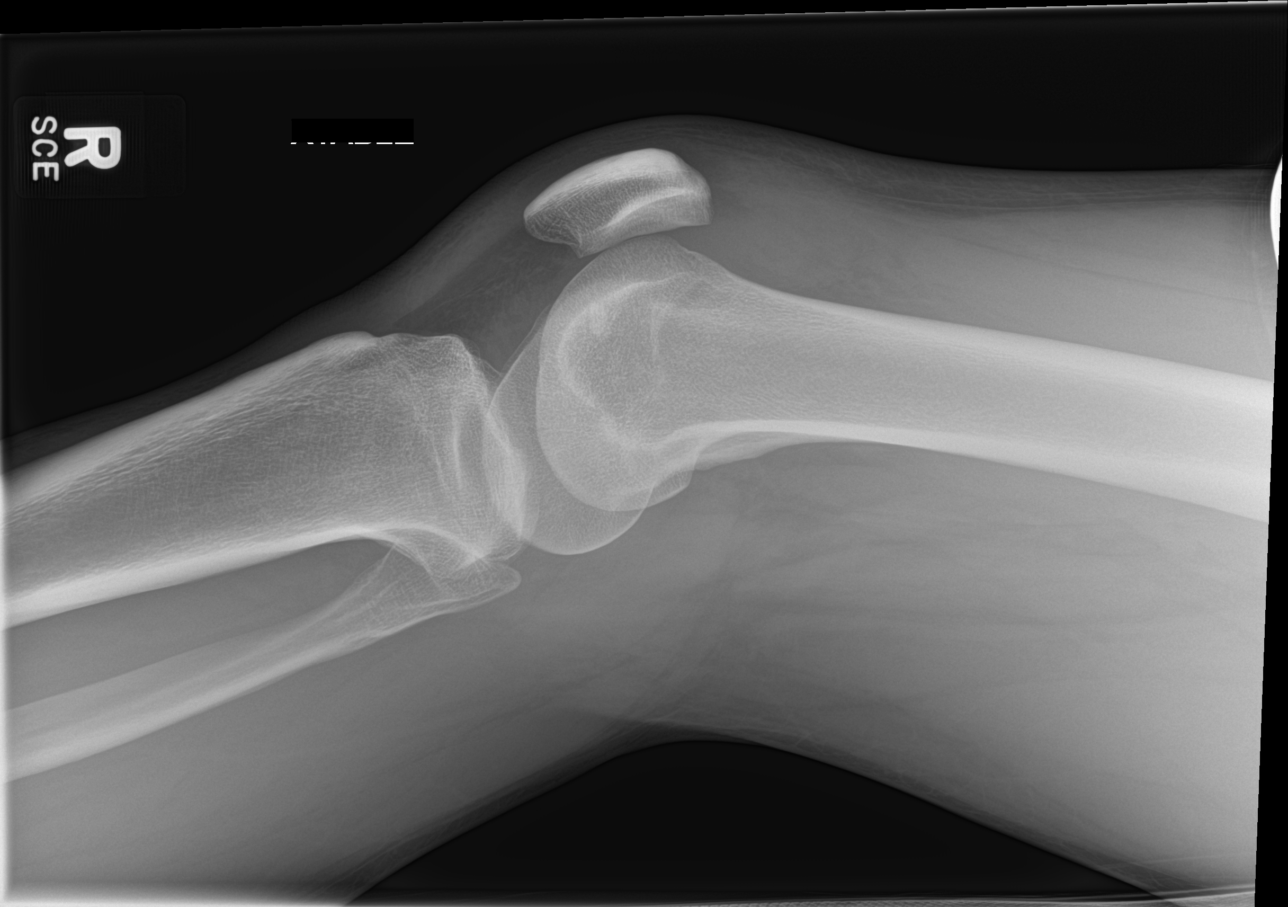

[knee obl]
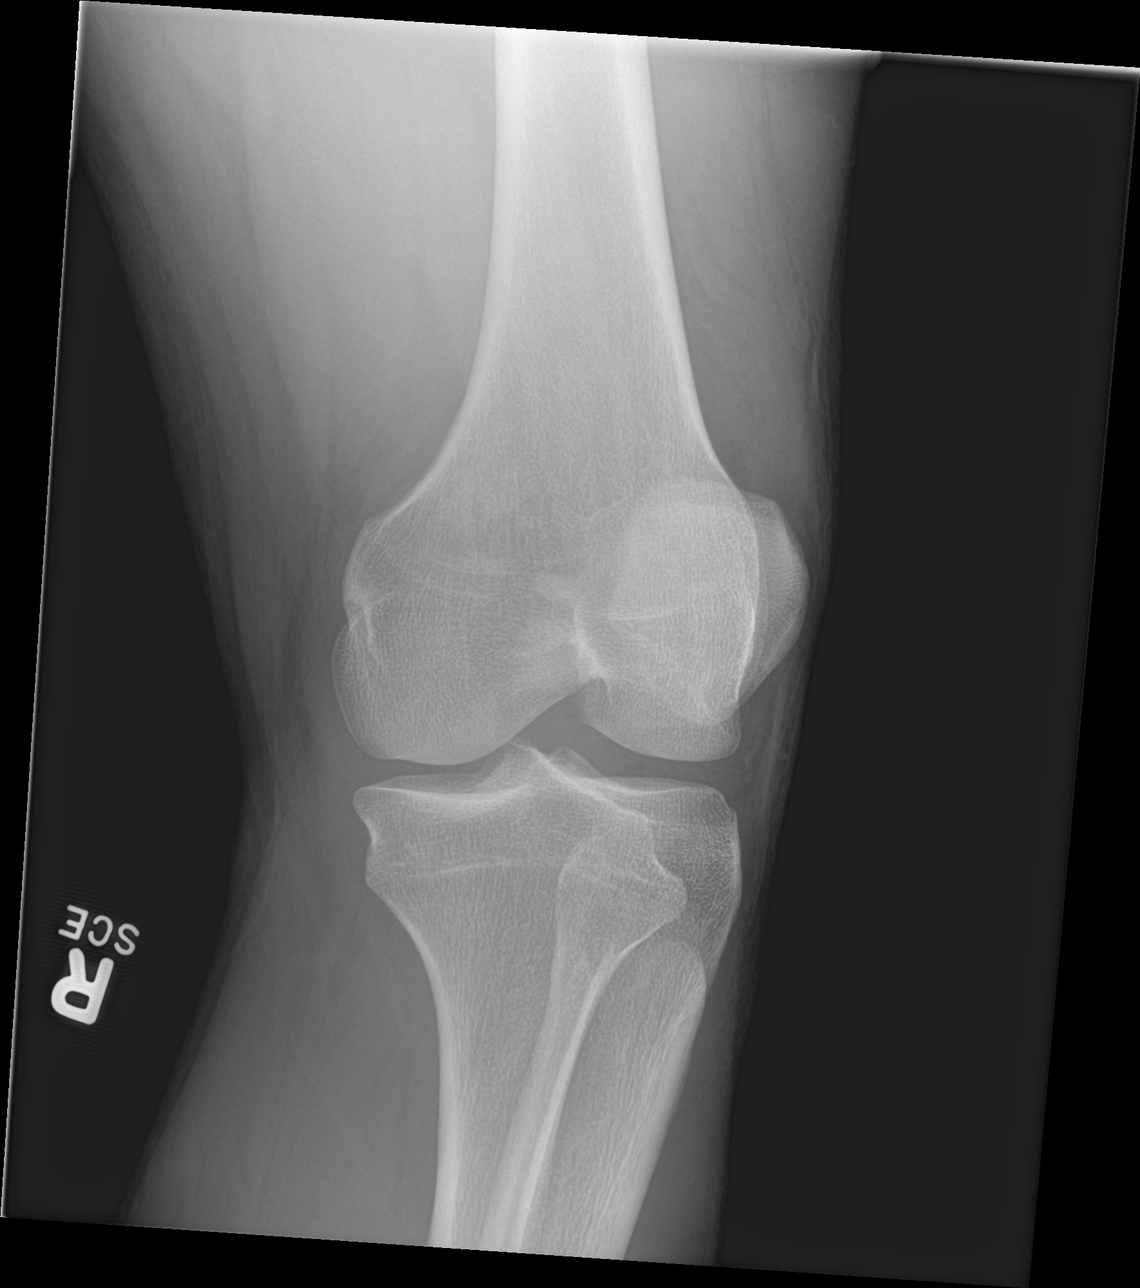

[4 of 4 positions shown; findings below may reference images not displayed]

FINDINGS: The right knee is located. A moderate-sized joint effusion is
present. There is no acute fracture. No foreign body is evident.
IMPRESSION: 1. Moderate-sized joint effusion without underlying fracture or
dislocation. Internal derangement is not excluded.

## 2017-07-18 ENCOUNTER — Encounter: Payer: Self-pay | Admitting: Urology

## 2017-07-18 ENCOUNTER — Ambulatory Visit (INDEPENDENT_AMBULATORY_CARE_PROVIDER_SITE_OTHER): Payer: Medicaid Other | Admitting: Urology

## 2017-07-18 VITALS — BP 124/71 | HR 67 | Ht 69.0 in | Wt 184.7 lb

## 2017-07-18 DIAGNOSIS — Z3009 Encounter for other general counseling and advice on contraception: Secondary | ICD-10-CM | POA: Diagnosis not present

## 2017-07-18 DIAGNOSIS — B356 Tinea cruris: Secondary | ICD-10-CM | POA: Diagnosis not present

## 2017-07-18 MED ORDER — DIAZEPAM 10 MG PO TABS: 10.0000 mg | ORAL_TABLET | Freq: Once | ORAL | 0 refills | Status: AC

## 2017-07-18 NOTE — Progress Notes (Signed)
07/18/2017 2:01 PM   Jerry Henson March 27, 1986 [id number]  Referring provider: Fernanda Drum, FNP No address on file  Chief Complaint  Patient presents with  . VAS Consult    HPI: He is here to consider vasectomy. He is engaged. He has three kids (age 32 yo, 47 yo, 40 yo). His fiancee has an 32 yo. They've considered vasectomy for 4 years. They've IUD for 4 years. He has had no pelvic pain or testicle or inguinal surgery.   Modifying factors: There are no other modifying factors  Associated signs and symptoms: There are no other associated signs and symptoms Aggravating and relieving factors: There are no other aggravating or relieving factors Severity: Moderate Duration: Persistent   PMH: Past Medical History:  Diagnosis Date  . Migraines     Surgical History: No past surgical history on file.  Home Medications:  Allergies as of 07/18/2017   No Known Allergies     Medication List       Accurate as of 07/18/17  2:01 PM. Always use your most recent med list.          diazepam 10 MG tablet Commonly known as:  VALIUM Take 1 tablet (10 mg total) by mouth once. Bring with you to vasectomy procedure   HYDROcodone-acetaminophen 5-325 MG tablet Commonly known as:  NORCO/VICODIN Take 1 tablet by mouth every 4 (four) hours as needed for moderate pain.       Allergies: No Known Allergies  Family History: Family History  Problem Relation Age of Onset  . Prostate cancer Neg Hx   . Bladder Cancer Neg Hx   . Kidney cancer Neg Hx     Social History:  reports that he has been smoking Cigarettes.  He has been smoking about 1.00 pack per day. He does not have any smokeless tobacco history on file. He reports that he drinks about 1.2 oz of alcohol per week . His drug history is not on file.  ROS: UROLOGY Frequent Urination?: No Hard to postpone urination?: No Burning/pain with urination?: No Get up at night to urinate?: No Leakage of urine?: No Urine  stream starts and stops?: No Trouble starting stream?: No Do you have to strain to urinate?: No Blood in urine?: No Urinary tract infection?: No Sexually transmitted disease?: No Injury to kidneys or bladder?: No Painful intercourse?: No Weak stream?: No Erection problems?: No Penile pain?: No  Gastrointestinal Nausea?: No Vomiting?: No Indigestion/heartburn?: No Diarrhea?: No Constipation?: No  Constitutional Fever: No Night sweats?: No Weight loss?: No Fatigue?: No  Skin Skin rash/lesions?: No Itching?: No  Eyes Blurred vision?: No Double vision?: No  Ears/Nose/Throat Sore throat?: No Sinus problems?: No  Hematologic/Lymphatic Swollen glands?: No Easy bruising?: No  Cardiovascular Leg swelling?: No Chest pain?: No  Respiratory Cough?: No Shortness of breath?: No  Endocrine Excessive thirst?: No  Musculoskeletal Back pain?: No Joint pain?: No  Neurological Headaches?: No Dizziness?: No  Psychologic Depression?: No Anxiety?: No  Physical Exam: There were no vitals taken for this visit.  Constitutional:  Alert and oriented, No acute distress. HEENT: [place] AT, moist mucus membranes.  Trachea midline, no masses. Cardiovascular: No clubbing, cyanosis, or edema. Respiratory: Normal respiratory effort, no increased work of breathing. GI: Abdomen is soft, nontender, nondistended, no abdominal masses GU: No CVA tenderness.  Penis:circumcised and normal.  Scrotum: testicles descended bilaterally and palpably normal; vas deferens palpably normal; tinea cruris above penis on SP area (erythematous ring lesions) Skin: No rashes, bruises  or suspicious lesions. Lymph: No cervical or inguinal adenopathy. Neurologic: Grossly intact, no focal deficits, moving all 4 extremities. Psychiatric: Normal mood and affect.  Laboratory Data: Lab Results  Component Value Date   WBC 7.8 03/21/2016   HGB 14.9 03/21/2016   HCT 43.1 03/21/2016   MCV 90.5 03/21/2016    PLT 174 03/21/2016    Lab Results  Component Value Date   CREATININE 0.92 03/21/2016    No results found for: PSA1  No results found for: TESTOSTERONE  No results found for: HGBA1C  Urinalysis Lab Results  Component Value Date   APPEARANCEUR HAZY (A) 03/21/2016   LEUKOCYTESUR NEGATIVE 03/21/2016   PROTEINUR NEGATIVE 03/21/2016   GLUCOSEU NEGATIVE 03/21/2016   RBCU NONE SEEN 03/21/2016   BILIRUBINUR NEGATIVE 03/21/2016   NITRITE NEGATIVE 03/21/2016    Lab Results  Component Value Date   BACTERIA NONE SEEN 03/21/2016   No results found for this or any previous visit. No results found for this or any previous visit. No results found for this or any previous visit. No results found for this or any previous visit. No results found for this or any previous visit. No results found for this or any previous visit. No results found for this or any previous visit. No results found for this or any previous visit.  Assessment & Plan:    1. Vasectomy evaluation I discussed with the patient normal male genitourinary anatomy and passage of sperm. We discussed the nature of vasectomy and that vasectomy is intended to be a permanent form of contraception. Although options do exist for fertility after vasectomy they are not always successful and can be quite expensive. We discussed vasectomy does not produce immediate sterility and another  form of contraception is required until a postvasectomy semen analysis confirms no sperm. We discussed this can take several weeks to months. We discussed that vasectomy is not 100% successful with the risk of pregnancy approximately 1 in 2000 following the procedure which may be due to late failure from rejoining of the vas ends.  Rarely repeat vasectomy is required. We discussed risks such as bleeding, infection, testicular atrophy, sperm granuloma, acute pain and permanent chronic scrotal pain among others. We discussed there are other permanent and  nonpermanent alternatives to vasectomy including male sterilization. We discussed postop care and that no heavy lifting, ejaculation or strenuous exercise is recommended for one week. All questions answered. I also discussed one of my colleagues may be doing the procedure.    No Follow-up on file.  Jerilee Field, MD  [facility] [address] [place], [place] [address] [phone]

## 2017-07-25 ENCOUNTER — Ambulatory Visit (INDEPENDENT_AMBULATORY_CARE_PROVIDER_SITE_OTHER): Payer: Self-pay | Admitting: Urology

## 2017-07-25 ENCOUNTER — Encounter: Payer: Self-pay | Admitting: Urology

## 2017-07-25 VITALS — BP 131/80 | HR 73 | Ht 69.0 in | Wt 178.5 lb

## 2017-07-25 DIAGNOSIS — Z3009 Encounter for other general counseling and advice on contraception: Secondary | ICD-10-CM

## 2017-07-25 MED ORDER — TRAMADOL HCL 50 MG PO TABS: 50.0000 mg | ORAL_TABLET | Freq: Four times a day (QID) | ORAL | 0 refills | Status: DC | PRN

## 2017-07-25 NOTE — Progress Notes (Signed)
07/25/17  CC:  Chief Complaint  Patient presents with  . VAS    HPI:  Blood pressure 131/80, pulse 73, height  (1.753 m), weight 178 lb 8 oz (81 kg). NED. A&Ox3.   No respiratory distress   Abd soft, NT, ND Normal external genitalia with patent urethral meatus   Bilateral Vasectomy Procedure  Pre-Procedure: - Patient's scrotum was prepped and draped for vasectomy. - The vas was palpated through the scrotal skin on the left. - 1% Xylocaine was injected into the skin and surrounding tissue for placement  - In a similar manner, the vas on the right was identified, anesthetized, and stabilized.  Procedure: - A bladeless technique was used to open the overlying skin (sharp dissection) - The left vas was isolated and brought up through the incision exposing that structure. - Bleeding points were cauterized as they occurred. - The vas was free from the surrounding structures and brought to the view. - A segment was positioned for placement with a hemostat. - A second hemostat was placed and a small segment between the two hemostats and was removed for inspection. - Each end of the transected vas lumen was fulgurated/ obliterated using needlepoint electrocautery -A fascial interposition was performed on testicular end of the vas using #3-0 chromic suture -The same procedure was performed on the right. - A single suture of #3-0 chromic catgut was used to close each lateral scrotal skin incision - A dressing was applied.  Post-Procedure: - Patient was instructed in care of the operative area - A specimen is to be delivered in 12 weeks   -Another form of contraception is to be used until post vasectomy semen analysis  HERRICK, Earle Gell, MD

## 2017-07-31 ENCOUNTER — Telehealth: Payer: Self-pay

## 2017-07-31 NOTE — Telephone Encounter (Signed)
Pt called stating he can feel a lump behind his testicles post vas. Pt denied n/v, f/c, tender to the touch, redness at incision site or where lump is. Pt stated that he is taking his ibuprofen/tylenol as recommended and otherwise is doing well post vas. Reinforced with pt to monitor the lump and if it grows, becomes tender to touch, testicles become more tender in general, heat presents or redness to call back. Pt voiced understanding of whole conversation.

## 2017-08-23 ENCOUNTER — Other Ambulatory Visit: Payer: Medicaid Other

## 2017-08-23 DIAGNOSIS — Z9852 Vasectomy status: Secondary | ICD-10-CM

## 2017-08-24 LAB — POST-VAS SPERM EVALUATION,QUAL: SEMEN VOLUME: 2.8 mL

## 2017-08-30 ENCOUNTER — Telehealth: Payer: Self-pay | Admitting: Urology

## 2017-08-30 NOTE — Telephone Encounter (Signed)
Pt called office asking results of his semen sample dropped off last week. States he hasn't had a call and is inquiring about his results. Please advise. Thanks.

## 2017-08-31 ENCOUNTER — Telehealth: Payer: Self-pay

## 2017-08-31 NOTE — Telephone Encounter (Signed)
Patient notified

## 2017-08-31 NOTE — Telephone Encounter (Signed)
Can you help me with this?

## 2017-08-31 NOTE — Telephone Encounter (Signed)
-----   Message from Crist FatBenjamin W Herrick, MD sent at 08/31/2017  7:44 AM EST ----- Please call patient and let him know that he is good to stop contraception.  Thanks.  BH ----- Message ----- From: Lissa HoardWatts, Sibel Khurana Michelle, CMA Sent: 08/25/2017   4:27 PM To: Crist FatBenjamin W Herrick, MD    ----- Message ----- From: Interface, [facility] Lab Results In Sent: 08/24/2017   1:38 PM To: Jennette K[facility]

## 2017-09-05 NOTE — Telephone Encounter (Signed)
Per Dr. Marlou PorchHerrick pt can stop birth control and does not need another specimen.

## 2018-05-27 ENCOUNTER — Emergency Department: Payer: Self-pay

## 2018-05-27 ENCOUNTER — Emergency Department
Admission: EM | Admit: 2018-05-27 | Discharge: 2018-05-27 | Disposition: A | Discharge status: Home / Self Care | Payer: Self-pay | Attending: Emergency Medicine | Admitting: Emergency Medicine

## 2018-05-27 ENCOUNTER — Encounter: Payer: Self-pay | Admitting: Emergency Medicine

## 2018-05-27 ENCOUNTER — Other Ambulatory Visit: Payer: Self-pay

## 2018-05-27 DIAGNOSIS — Y929 Unspecified place or not applicable: Secondary | ICD-10-CM | POA: Insufficient documentation

## 2018-05-27 DIAGNOSIS — F17210 Nicotine dependence, cigarettes, uncomplicated: Secondary | ICD-10-CM | POA: Insufficient documentation

## 2018-05-27 DIAGNOSIS — Y9389 Activity, other specified: Secondary | ICD-10-CM | POA: Insufficient documentation

## 2018-05-27 DIAGNOSIS — S60221A Contusion of right hand, initial encounter: Secondary | ICD-10-CM | POA: Insufficient documentation

## 2018-05-27 DIAGNOSIS — W231XXA Caught, crushed, jammed, or pinched between stationary objects, initial encounter: Secondary | ICD-10-CM | POA: Insufficient documentation

## 2018-05-27 DIAGNOSIS — Y998 Other external cause status: Secondary | ICD-10-CM | POA: Insufficient documentation

## 2018-05-27 MED ORDER — NABUMETONE 750 MG PO TABS: 750.0000 mg | ORAL_TABLET | Freq: Two times a day (BID) | ORAL | 0 refills | Status: DC
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
## 2018-05-27 NOTE — ED Notes (Signed)
Pt reports that he was chopping wood with his family member when his hand got stuck between the wood and his house. Right hand is red and swollen. Pt is 3/10 pain.

## 2018-05-27 NOTE — Discharge Instructions (Addendum)
Your have a hand contusion without evidence of fracture of dislocation. Apply ice to reduce swelling and warm epsom salt soaks for increased range of motion. Take the pain & inflammation medicine as directed. Follow-up with ortho if needed.
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
## 2018-05-29 ENCOUNTER — Emergency Department: Payer: Self-pay

## 2018-05-29 ENCOUNTER — Emergency Department
Admission: EM | Admit: 2018-05-29 | Discharge: 2018-05-29 | Disposition: A | Discharge status: Home / Self Care | Payer: Self-pay | Attending: Emergency Medicine | Admitting: Emergency Medicine

## 2018-05-29 ENCOUNTER — Encounter: Payer: Self-pay | Admitting: Emergency Medicine

## 2018-05-29 DIAGNOSIS — Y9389 Activity, other specified: Secondary | ICD-10-CM | POA: Insufficient documentation

## 2018-05-29 DIAGNOSIS — Y929 Unspecified place or not applicable: Secondary | ICD-10-CM | POA: Insufficient documentation

## 2018-05-29 DIAGNOSIS — F17210 Nicotine dependence, cigarettes, uncomplicated: Secondary | ICD-10-CM | POA: Insufficient documentation

## 2018-05-29 DIAGNOSIS — S60221A Contusion of right hand, initial encounter: Secondary | ICD-10-CM | POA: Insufficient documentation

## 2018-05-29 DIAGNOSIS — Y998 Other external cause status: Secondary | ICD-10-CM | POA: Insufficient documentation

## 2018-05-29 DIAGNOSIS — T148XXA Other injury of unspecified body region, initial encounter: Secondary | ICD-10-CM

## 2018-05-29 DIAGNOSIS — W208XXA Other cause of strike by thrown, projected or falling object, initial encounter: Secondary | ICD-10-CM | POA: Insufficient documentation

## 2018-05-29 DIAGNOSIS — Z23 Encounter for immunization: Secondary | ICD-10-CM | POA: Insufficient documentation

## 2018-05-29 DIAGNOSIS — L089 Local infection of the skin and subcutaneous tissue, unspecified: Secondary | ICD-10-CM | POA: Insufficient documentation

## 2018-05-29 MED ORDER — TETANUS-DIPHTH-ACELL PERTUSSIS 5-2.5-18.5 LF-MCG/0.5 IM SUSP
0.5000 mL | Freq: Once | INTRAMUSCULAR | Status: AC
  Administered 2018-05-29: 0.5 mL via INTRAMUSCULAR
  Filled 2018-05-29: qty 0.5

## 2018-05-29 MED ORDER — AMOXICILLIN-POT CLAVULANATE 875-125 MG PO TABS: 1.0000 | ORAL_TABLET | Freq: Two times a day (BID) | ORAL | 0 refills | Status: DC

## 2018-05-29 NOTE — Discharge Instructions (Addendum)
Follow-up with Dr. Ernest PineHooten if you are not better in 5 7 days.  Return emergency department if worsening.  Take the antibiotic as prescribed as you do have a wound infection.  If the redness spreads above the wrist you will need to return to the emergency department for IV antibiotics.

## 2018-05-29 NOTE — ED Triage Notes (Signed)
Presents with pain and swelling to right hand  States he was seen Sunday for initial injury  But today  Hit his hand again  Swelling is moving into wrist area   Good pulses
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
## 2018-07-07 ENCOUNTER — Emergency Department: Payer: Medicaid Other

## 2018-07-07 ENCOUNTER — Other Ambulatory Visit: Payer: Self-pay

## 2018-07-07 DIAGNOSIS — N452 Orchitis: Secondary | ICD-10-CM | POA: Insufficient documentation

## 2018-07-07 DIAGNOSIS — F17210 Nicotine dependence, cigarettes, uncomplicated: Secondary | ICD-10-CM | POA: Insufficient documentation

## 2018-07-07 DIAGNOSIS — Z79899 Other long term (current) drug therapy: Secondary | ICD-10-CM | POA: Insufficient documentation

## 2018-07-07 NOTE — ED Triage Notes (Signed)
Patient reports left testicle pain for past 2 hours.

## 2018-07-08 ENCOUNTER — Telehealth: Payer: Self-pay | Admitting: Urology

## 2018-07-08 ENCOUNTER — Emergency Department
Admission: EM | Admit: 2018-07-08 | Discharge: 2018-07-08 | Disposition: A | Discharge status: Home / Self Care | Payer: Medicaid Other | Attending: Emergency Medicine | Admitting: Emergency Medicine

## 2018-07-08 ENCOUNTER — Encounter: Payer: Self-pay | Admitting: Emergency Medicine

## 2018-07-08 ENCOUNTER — Emergency Department: Payer: Medicaid Other

## 2018-07-08 ENCOUNTER — Other Ambulatory Visit: Payer: Self-pay

## 2018-07-08 DIAGNOSIS — F17210 Nicotine dependence, cigarettes, uncomplicated: Secondary | ICD-10-CM | POA: Insufficient documentation

## 2018-07-08 DIAGNOSIS — N50819 Testicular pain, unspecified: Secondary | ICD-10-CM

## 2018-07-08 DIAGNOSIS — N452 Orchitis: Secondary | ICD-10-CM

## 2018-07-08 DIAGNOSIS — N50812 Left testicular pain: Secondary | ICD-10-CM | POA: Insufficient documentation

## 2018-07-08 DIAGNOSIS — Z79899 Other long term (current) drug therapy: Secondary | ICD-10-CM | POA: Insufficient documentation

## 2018-07-08 LAB — URINALYSIS, COMPLETE (UACMP) WITH MICROSCOPIC
Bacteria, UA: NONE SEEN
Bilirubin Urine: NEGATIVE
Glucose, UA: NEGATIVE mg/dL
HGB URINE DIPSTICK: NEGATIVE
Ketones, ur: NEGATIVE mg/dL
Leukocytes, UA: NEGATIVE
Nitrite: NEGATIVE
Protein, ur: NEGATIVE mg/dL
SPECIFIC GRAVITY, URINE: 1.01 (ref 1.005–1.030)
Squamous Epithelial / LPF: NONE SEEN (ref 0–5)
WBC UA: NONE SEEN WBC/hpf (ref 0–5)
pH: 7 (ref 5.0–8.0)

## 2018-07-08 LAB — CBC WITH DIFFERENTIAL/PLATELET
Basophils Absolute: 0 10*3/uL (ref 0–0.1)
Basophils Relative: 1 %
EOS ABS: 0.2 10*3/uL (ref 0–0.7)
EOS PCT: 4 %
HCT: 45.5 % (ref 40.0–52.0)
Hemoglobin: 16.1 g/dL (ref 13.0–18.0)
LYMPHS ABS: 1.5 10*3/uL (ref 1.0–3.6)
LYMPHS PCT: 25 %
MCH: 32.6 pg (ref 26.0–34.0)
MCHC: 35.3 g/dL (ref 32.0–36.0)
MCV: 92.3 fL (ref 80.0–100.0)
MONO ABS: 0.6 10*3/uL (ref 0.2–1.0)
MONOS PCT: 10 %
Neutro Abs: 3.7 10*3/uL (ref 1.4–6.5)
Neutrophils Relative %: 60 %
PLATELETS: 197 10*3/uL (ref 150–440)
RBC: 4.93 MIL/uL (ref 4.40–5.90)
RDW: 12.2 % (ref 11.5–14.5)
WBC: 6.1 10*3/uL (ref 3.8–10.6)

## 2018-07-08 LAB — URINALYSIS, ROUTINE W REFLEX MICROSCOPIC
Bilirubin Urine: NEGATIVE
Glucose, UA: NEGATIVE mg/dL
HGB URINE DIPSTICK: NEGATIVE
Ketones, ur: NEGATIVE mg/dL
Leukocytes, UA: NEGATIVE
Nitrite: NEGATIVE
Protein, ur: NEGATIVE mg/dL
SPECIFIC GRAVITY, URINE: 1.004 — AB (ref 1.005–1.030)
pH: 6 (ref 5.0–8.0)

## 2018-07-08 LAB — BASIC METABOLIC PANEL
Anion gap: 8 (ref 5–15)
BUN: 10 mg/dL (ref 6–20)
CO2: 28 mmol/L (ref 22–32)
CREATININE: 1.02 mg/dL (ref 0.61–1.24)
Calcium: 8.9 mg/dL (ref 8.9–10.3)
Chloride: 104 mmol/L (ref 98–111)
GFR calc Af Amer: >60 mL/min (ref >60)
Glucose, Bld: 103 mg/dL — ABNORMAL HIGH (ref 70–99)
POTASSIUM: 4.3 mmol/L (ref 3.5–5.1)
Sodium: 140 mmol/L (ref 135–145)

## 2018-07-08 MED ORDER — DOXYCYCLINE HYCLATE 100 MG PO CAPS: 100.0000 mg | ORAL_CAPSULE | Freq: Two times a day (BID) | ORAL | 0 refills | Status: DC

## 2018-07-08 MED ORDER — DOXYCYCLINE HYCLATE 100 MG PO TABS
100.0000 mg | ORAL_TABLET | Freq: Once | ORAL | Status: AC
  Administered 2018-07-08: 100 mg via ORAL
  Filled 2018-07-08: qty 1

## 2018-07-08 MED ORDER — OXYCODONE-ACETAMINOPHEN 5-325 MG PO TABS: 1.0000 | ORAL_TABLET | ORAL | 0 refills | Status: DC | PRN

## 2018-07-08 MED ORDER — CEFTRIAXONE SODIUM 250 MG IJ SOLR
250.0000 mg | Freq: Once | INTRAMUSCULAR | Status: AC
  Administered 2018-07-08: 250 mg via INTRAMUSCULAR
  Filled 2018-07-08: qty 250

## 2018-07-08 MED ORDER — KETOROLAC TROMETHAMINE 30 MG/ML IJ SOLN
30.0000 mg | Freq: Once | INTRAMUSCULAR | Status: AC
  Administered 2018-07-08: 30 mg via INTRAMUSCULAR
  Filled 2018-07-08: qty 1

## 2018-07-08 NOTE — ED Provider Notes (Signed)
[facility] Emergency Department Provider Note ____________________________________________   First MD Initiated Contact with Patient 07/08/18 0038     (approximate)  I have reviewed the triage vital signs and the nursing notes.   HISTORY  Chief Complaint Testicle Pain   HPI Jerry Henson is a 32 y.o. male with a history of vasectomy approximately 1 year ago as well as migraines and kidney stones was presenting the emergency department with left-sided testicular pain.  He states the pain started approximate 2 hours prior to arrival at about 8:30 PM.  He states the pain is severe when he moves.  Denies any swelling in his testicle.  Denies any burning with urination or back pain.  Says the pain started suddenly when he was sitting watching television.   Past Medical History:  Diagnosis Date  . Migraines     There are no active problems to display for this patient.   No past surgical history on file.  Prior to Admission medications   Medication Sig Start Date End Date Taking? Authorizing Provider  amoxicillin-clavulanate (AUGMENTIN) 875-125 MG tablet Take 1 tablet by mouth 2 (two) times daily. 05/29/18   Fisher, Roselyn Bering, PA-C  nabumetone (RELAFEN) 750 MG tablet Take 1 tablet (750 mg total) by mouth 2 (two) times daily. 05/27/18   Menshew, Charlesetta Ivory, PA-C    Allergies Pepto-bismol [bismuth subsalicylate]  Family History  Problem Relation Age of Onset  . Prostate cancer Neg Hx   . Bladder Cancer Neg Hx   . Kidney cancer Neg Hx     Social History Social History   Tobacco Use  . Smoking status: Current Every Day Smoker    Packs/day: 1.00    Types: Cigarettes  . Smokeless tobacco: Current User    Types: Chew  Substance Use Topics  . Alcohol use: Yes    Alcohol/week: 2.0 standard drinks    Types: 2 Cans of beer per week  . Drug use: Never    Review of Systems  Constitutional: No fever/chills Eyes: No visual changes. ENT: No sore  throat. Cardiovascular: Denies chest pain. Respiratory: Denies shortness of breath. Gastrointestinal: No abdominal pain.  No nausea, no vomiting.  No diarrhea.  No constipation. Genitourinary: As above  musculoskeletal: Negative for back pain. Skin: Negative for rash. Neurological: Negative for headaches, focal weakness or numbness.   ____________________________________________   PHYSICAL EXAM:  VITAL SIGNS: ED Triage Vitals  Enc Vitals Group     BP 07/07/18 2307 130/79     Pulse Rate 07/07/18 2307 66     Resp 07/07/18 2307 18     Temp 07/07/18 2307 98.5 F (36.9 C)     Temp Source 07/07/18 2307 Oral     SpO2 07/07/18 2307 97 %     Weight 07/07/18 2253 190 lb (86.2 kg)     Height 07/07/18 2253 5\' 9"  (1.753 m)     Head Circumference --      Peak Flow --      Pain Score 07/07/18 2253 9     Pain Loc --      Pain Edu? --      Excl. in GC? --     Constitutional: Alert and oriented. Well appearing and in no acute distress. Eyes: Conjunctivae are normal.  Head: Atraumatic. Nose: No congestion/rhinnorhea. Mouth/Throat: Mucous membranes are moist.  Neck: No stridor.   Cardiovascular: Normal rate, regular rhythm. Grossly normal heart sounds.   Respiratory: Normal respiratory effort.  No retractions.  Lungs CTAB. Gastrointestinal: Soft and nontender. No distention.  No CVA tenderness to palpation, bilaterally. Genitourinary: Left testicle is high riding and very tender to palpation.  No epididymal tenderness to palpation.  No penile tenderness.  Patient unable to tolerate open book technique of rotation for detorsion. Musculoskeletal: No lower extremity tenderness nor edema.  No joint effusions. Neurologic:  Normal speech and language. No gross focal neurologic deficits are appreciated. Skin:  Skin is warm, dry and intact. No rash noted. Psychiatric: Mood and affect are normal. Speech and behavior are normal.  ____________________________________________   LABS (all labs  ordered are listed, but only abnormal results are displayed)  Labs Reviewed  URINALYSIS, ROUTINE W REFLEX MICROSCOPIC - Abnormal; Notable for the following components:      Result Value   Color, Urine STRAW (*)    APPearance CLEAR (*)    Specific Gravity, Urine 1.004 (*)    All other components within normal limits   ____________________________________________  EKG   ____________________________________________  RADIOLOGY  Small bilateral hydroceles.  No evidence of torsion.  Testicular microlithiasis. ____________________________________________   PROCEDURES  Procedure(s) performed:   Procedures  Critical Care performed:   ____________________________________________   INITIAL IMPRESSION / ASSESSMENT AND PLAN / ED COURSE  Pertinent labs & imaging results that were available during my care of the patient were reviewed by me and considered in my medical decision making (see chart for details).  DDX: Testicular torsion, orchitis, epididymitis, testicular trauma As part of my medical decision making, I reviewed the following data within the electronic MEDICAL RECORD NUMBER Notes from prior ED visits  ----------------------------------------- 2:15 AM on 07/08/2018 -----------------------------------------  At this point we have sent out 2 pages to urology.  I concern for torsion especially because of the high riding left testicle.  However, we have not received a call back thus far from Dr. Lennox LaityEstridge.  ----------------------------------------- 3:54 AM on 07/08/2018 -----------------------------------------  After 2 pages in a call the office I was able to see with Dr. Mena GoesEskridge.  He reviewed the patient's imaging and states that it does not look like the patient has a torsion.  He says that he sees increased flow to the left testicle and patient to be treated for orchitis.  I discussed this with the patient who appears to be resting comfortably at this time.  He is  understanding of the treatment plan willing to comply but will be discharged home.  He knows that he must follow-up with urology. ____________________________________________   FINAL CLINICAL IMPRESSION(S) / ED DIAGNOSES  Final diagnoses:  Testicular pain  Orchitis.    NEW MEDICATIONS STARTED DURING THIS VISIT:  New Prescriptions   No medications on file     Note:  This document was prepared using Dragon voice recognition software and may include unintentional dictation errors.     Myrna BlazerSchaevitz, Aadon Gorelik Matthew, MD 07/08/18 [phone]

## 2018-07-08 NOTE — ED Provider Notes (Signed)
[facility] Emergency Department Provider Note  ____________________________________________   I have reviewed the triage vital signs and the nursing notes. Where available I have reviewed prior notes and, if possible and indicated, outside hospital notes.    HISTORY  Chief Complaint Testicle Pain    HPI Jerry Henson is a 32 y.o. male  Presents today complaining of ongoing testicular pain.  He was seen here yesterday had a thorough work-up by the emergency physician overnight.  He went home this morning between 4 and 5 AM it looks like.  He states that since going home he has had continued left testicular pain and it seemed to get a little worse when he walked around.  He talked to the urologist who recommended that he get rechecked.  Patient states that he has not yet taken his home antibiotics but he has had them filled.  He has taken some Motrin for the pain but not recently.  He denies any fever chills he has had no trauma.  The pain started yesterday, he has had no disruption of his bowel or bladder habits, he has had no dysuria urinary frequency there is no redness or swelling to the scrotum itself, he just states the pain is feeling significant and he wants to make sure nothing is changed As a sharp tolerating left testicular pain worse when he walks around  Past Medical History:  Diagnosis Date  . Migraines     There are no active problems to display for this patient.   History reviewed. No pertinent surgical history.  Prior to Admission medications   Medication Sig Start Date End Date Taking? Authorizing Provider  amoxicillin-clavulanate (AUGMENTIN) 875-125 MG tablet Take 1 tablet by mouth 2 (two) times daily. 05/29/18   Fisher, Roselyn Bering, PA-C  doxycycline (VIBRAMYCIN) 100 MG capsule Take 1 capsule (100 mg total) by mouth 2 (two) times daily. 07/08/18   Schaevitz, Myra Rude, MD  nabumetone (RELAFEN) 750 MG tablet Take 1 tablet (750 mg total) by  mouth 2 (two) times daily. 05/27/18   Menshew, Charlesetta Ivory, PA-C    Allergies Pepto-bismol [bismuth subsalicylate]  Family History  Problem Relation Age of Onset  . Prostate cancer Neg Hx   . Bladder Cancer Neg Hx   . Kidney cancer Neg Hx     Social History Social History   Tobacco Use  . Smoking status: Current Every Day Smoker    Packs/day: 1.00    Types: Cigarettes  . Smokeless tobacco: Current User    Types: Chew  Substance Use Topics  . Alcohol use: Yes    Alcohol/week: 2.0 standard drinks    Types: 2 Cans of beer per week  . Drug use: Never    Review of Systems Constitutional: No fever/chills Eyes: No visual changes. ENT: No sore throat. No stiff neck no neck pain Cardiovascular: Denies chest pain. Respiratory: Denies shortness of breath. Gastrointestinal:   no vomiting.  No diarrhea.  No constipation. Genitourinary: Negative for dysuria. Musculoskeletal: Negative lower extremity swelling Skin: Negative for rash. Neurological: Negative for severe headaches, focal weakness or numbness.   ____________________________________________   PHYSICAL EXAM:  VITAL SIGNS: ED Triage Vitals  Enc Vitals Group     BP 07/08/18 1539 (!) 141/81     Pulse Rate 07/08/18 1539 76     Resp 07/08/18 1539 18     Temp 07/08/18 1539 98.3 F (36.8 C)     Temp Source 07/08/18 1539 Oral     SpO2  07/08/18 1539 98 %     Weight 07/08/18 1539 190 lb (86.2 kg)     Height 07/08/18 1539 5\' 9"  (1.753 m)     Head Circumference --      Peak Flow --      Pain Score 07/08/18 1543 8     Pain Loc --      Pain Edu? --      Excl. in GC? --     Constitutional: Alert and oriented. Well appearing and in no acute distress. Eyes: Conjunctivae are normal Head: Atraumatic HEENT: No congestion/rhinnorhea. Mucous membranes are moist.  Oropharynx non-erythematous Neck:   Nontender with no meningismus, no masses, no stridor Cardiovascular: Normal rate, regular rhythm. Grossly normal heart  sounds.  Good peripheral circulation. Respiratory: Normal respiratory effort.  No retractions. Lungs CTAB. Abdominal: Soft and nontender. No distention. No guarding no rebound Back:  There is no focal tenderness or step off.  there is no midline tenderness there are no lesions noted. there is no CVA tenderness Penis is normal in appearance, there is no swelling there is no discharge, the scrotum is normal appearance with normal cremasteric reflexes no erythema or induration, testicles normal to palpation with no tenderness or swelling, left testicle feels mildly swollen, however there is no significant edema, it is tender to palpation.  There is no mass, there is no herniation noted in the inguinal canal. Musculoskeletal: No lower extremity tenderness, no upper extremity tenderness. No joint effusions, no DVT signs strong distal pulses no edema Neurologic:  Normal speech and language. No gross focal neurologic deficits are appreciated.  Skin:  Skin is warm, dry and intact. No rash noted. Psychiatric: Mood and affect are normal. Speech and behavior are normal.  ____________________________________________   LABS (all labs ordered are listed, but only abnormal results are displayed)  Labs Reviewed  BASIC METABOLIC PANEL - Abnormal; Notable for the following components:      Result Value   Glucose, Bld 103 (*)    All other components within normal limits  URINALYSIS, COMPLETE (UACMP) WITH MICROSCOPIC - Abnormal; Notable for the following components:   Color, Urine YELLOW (*)    APPearance CLEAR (*)    All other components within normal limits  CBC WITH DIFFERENTIAL/PLATELET    Pertinent labs  results that were available during my care of the patient were reviewed by me and considered in my medical decision making (see chart for details). ____________________________________________  EKG  I personally interpreted any EKGs ordered by me or  triage  ____________________________________________  RADIOLOGY  Pertinent labs & imaging results that were available during my care of the patient were reviewed by me and considered in my medical decision making (see chart for details). If possible, patient and/or family made aware of any abnormal findings.  US Scrotum  Result Date: 07/08/2018 CLINICAL DATA:  Left testicular pain. EXAM: SCROTAL ULTRASOUND DOPPLER ULTRASOUND OF THE TESTICLES TECHNIQUE: Complete ultrasound examination of the testicles, epididymis, and other scrotal structures was performed. Color and spectral Doppler ultrasound were also utilized to evaluate blood flow to the testicles. COMPARISON:  Scrotal ultrasound performed yesterday FINDINGS: Right testicle Measurements: 4.3 x 2.0 x 2.6 cm. Few scattered microcalcifications. No mass. Left testicle Measurements: 4.2 x 2.0 x 2.8 cm. The previously seen microcalcifications not well visualized on today's study. No mass. Right epididymis:  Small cysts, the largest 2-3 mm. Left epididymis:  Small cyst measuring 4 mm Hydrocele:  Small right hydrocele Varicocele:  Mild left varicocele. Pulsed Doppler interrogation  of both testes demonstrates normal low resistance arterial and venous waveforms bilaterally. IMPRESSION: No testicular abnormality. Small bilateral epididymal cysts. Small right hydrocele. Mild left varicocele. Electronically Signed   By: Charlett NoseKevin  Dover M.D.   On: 07/08/2018 16:50   [place] Scrotum  Result Date: 07/07/2018 CLINICAL DATA:  Left testicular pain EXAM: SCROTAL ULTRASOUND DOPPLER ULTRASOUND OF THE TESTICLES TECHNIQUE: Complete ultrasound examination of the testicles, epididymis, and other scrotal structures was performed. Color and spectral Doppler ultrasound were also utilized to evaluate blood flow to the testicles. COMPARISON:  None. FINDINGS: Right testicle Measurements: 4.1 x 1.7 x 2.9 cm. Scattered microcalcifications. No mass. Left testicle Measurements: 4.2 x 1.8 x  3.4 cm. Scattered microcalcifications. No mass. Right epididymis:  Normal in size and appearance. Left epididymis:  Normal in size and appearance. Hydrocele:  Small bilateral hydroceles Varicocele:  None visualized. Pulsed Doppler interrogation of both testes demonstrates normal low resistance arterial and venous waveforms bilaterally. IMPRESSION: Small bilateral hydroceles. No evidence of testicular torsion. Testicular microlithiasis. Current literature suggests that testicular microlithiasis is not a significant independent risk factor for development of testicular carcinoma, and that follow up imaging is not warranted in the absence of other risk factors. Monthly testicular self-examination and annual physical exams are considered appropriate surveillance. If patient has other risk factors for testicular carcinoma, then referral to Urology should be considered. (Reference: DeCastro, et al.: A 5-Year Follow up Study of Asymptomatic Men with Testicular Microlithiasis. J Urol 2008; 179:1420-1423.) Electronically Signed   By: Charlett NoseKevin  Dover M.D.   On: 07/07/2018 23:52   [place] Pelvic Doppler (torsion R/o Or Mass Arterial Flow)  Result Date: 07/08/2018 CLINICAL DATA:  Left testicular pain. EXAM: SCROTAL ULTRASOUND DOPPLER ULTRASOUND OF THE TESTICLES TECHNIQUE: Complete ultrasound examination of the testicles, epididymis, and other scrotal structures was performed. Color and spectral Doppler ultrasound were also utilized to evaluate blood flow to the testicles. COMPARISON:  Scrotal ultrasound performed yesterday FINDINGS: Right testicle Measurements: 4.3 x 2.0 x 2.6 cm. Few scattered microcalcifications. No mass. Left testicle Measurements: 4.2 x 2.0 x 2.8 cm. The previously seen microcalcifications not well visualized on today's study. No mass. Right epididymis:  Small cysts, the largest 2-3 mm. Left epididymis:  Small cyst measuring 4 mm Hydrocele:  Small right hydrocele Varicocele:  Mild left varicocele. Pulsed  Doppler interrogation of both testes demonstrates normal low resistance arterial and venous waveforms bilaterally. IMPRESSION: No testicular abnormality. Small bilateral epididymal cysts. Small right hydrocele. Mild left varicocele. Electronically Signed   By: Charlett NoseKevin  Dover M.D.   On: 07/08/2018 16:50   [place] Pelvic Doppler (torsion R/o Or Mass Arterial Flow)  Result Date: 07/07/2018 CLINICAL DATA:  Left testicular pain EXAM: SCROTAL ULTRASOUND DOPPLER ULTRASOUND OF THE TESTICLES TECHNIQUE: Complete ultrasound examination of the testicles, epididymis, and other scrotal structures was performed. Color and spectral Doppler ultrasound were also utilized to evaluate blood flow to the testicles. COMPARISON:  None. FINDINGS: Right testicle Measurements: 4.1 x 1.7 x 2.9 cm. Scattered microcalcifications. No mass. Left testicle Measurements: 4.2 x 1.8 x 3.4 cm. Scattered microcalcifications. No mass. Right epididymis:  Normal in size and appearance. Left epididymis:  Normal in size and appearance. Hydrocele:  Small bilateral hydroceles Varicocele:  None visualized. Pulsed Doppler interrogation of both testes demonstrates normal low resistance arterial and venous waveforms bilaterally. IMPRESSION: Small bilateral hydroceles. No evidence of testicular torsion. Testicular microlithiasis. Current literature suggests that testicular microlithiasis is not a significant independent risk factor for development of testicular carcinoma, and that follow up imaging is  not warranted in the absence of other risk factors. Monthly testicular self-examination and annual physical exams are considered appropriate surveillance. If patient has other risk factors for testicular carcinoma, then referral to Urology should be considered. (Reference: DeCastro, et al.: A 5-Year Follow up Study of Asymptomatic Men with Testicular Microlithiasis. J Urol 2008; 179:1420-1423.) Electronically Signed   By: Charlett Nose M.D.   On: 07/07/2018 23:52    ____________________________________________    PROCEDURES  Procedure(s) performed: None  Procedures  Critical Care performed: None  ____________________________________________   INITIAL IMPRESSION / ASSESSMENT AND PLAN / ED COURSE  Pertinent labs & imaging results that were available during my care of the patient were reviewed by me and considered in my medical decision making (see chart for details).  Patient with ongoing testicular discomfort, well-appearing otherwise, we did repeat the work-up including his urinalysis which is reassuring white count which is reassuring and ultrasound which is normal.  I did discuss with Dr. Marlou Porch, on-call for urology.  He did advise a shot of Toradol here, continuing with the plan at home for antibiotics which have already been filled, and return precautions which have been given and understood.  I will also add Percocet to his regimen to ensure that he has adequate pain control until the antibiotics kick in.  This is most likely a epididymitis or orchitis but there is no evidence of torsion or mass or hernia etc.  Nothing to suggest Fournier's gangrene.  He will follow closely with urology.  Return precautions given understood    ____________________________________________   FINAL CLINICAL IMPRESSION(S) / ED DIAGNOSES  Final diagnoses:  Testicular pain      This chart was dictated using voice recognition software.  Despite best efforts to proofread,  errors can occur which can change meaning.      Jeanmarie Plant, MD 07/08/18 1755

## 2018-07-08 NOTE — Telephone Encounter (Signed)
I called to check on the pt and he notes the left testicle feels more drawn up, more swelling and more pain. This is all c/w epididymo-orchitis but I recommended he go back to ED for another scrotal US just to confirm blood flow to left testicle. Pt does note he frequently gets this drawn up feeling prior to ejaculation. He asked about doxy being for an "STD" and we discussed it's also a standard abx for male GU infections not from an STD. Pt monogamous with wife and did not feel his is a risk for STD. He has no dysuria or urethral discharge.

## 2018-07-08 NOTE — ED Triage Notes (Signed)
Pt here for left testicular pain that is worse than yesterday.  Increased swelling and feels like retracted up more than yesterday. No fever.

## 2018-07-08 NOTE — ED Notes (Signed)
Topaz pad not working. Pt verbally expressed understanding the need to follow up with urology and discharge instructions.

## 2018-07-08 NOTE — ED Notes (Signed)
Awaiting urology

## 2018-07-08 NOTE — Discharge Instructions (Addendum)
Take the antibiotics as prescribed, use ice and ibuprofen as directed to keep the pain under control.  For breakthrough pain, use Percocet.  Do not drink or drive on Percocet.  Follow closely with urology.  If you have increased pain fever swelling or other concerns return to the emergency department.

## 2018-07-09 ENCOUNTER — Encounter: Payer: Self-pay | Admitting: Urology

## 2018-07-09 ENCOUNTER — Ambulatory Visit (INDEPENDENT_AMBULATORY_CARE_PROVIDER_SITE_OTHER): Payer: Self-pay | Admitting: Urology

## 2018-07-09 VITALS — BP 148/94 | HR 72 | Ht 69.0 in | Wt 185.4 lb

## 2018-07-09 DIAGNOSIS — N451 Epididymitis: Secondary | ICD-10-CM

## 2018-07-09 MED ORDER — ETODOLAC 400 MG PO TABS: 400.0000 mg | ORAL_TABLET | Freq: Two times a day (BID) | ORAL | 1 refills | Status: DC | PRN

## 2018-07-09 NOTE — Progress Notes (Signed)
07/09/2018 2:02 PM   Jerry Henson 03-10-1986 [id number]  Referring provider: No referring provider defined for this encounter.  Chief Complaint  Patient presents with  . Testicle Pain    HPI: 32 year old male presents for evaluation of left hemiscrotal pain and swelling.  He was seen in the ED at 0200 yesterday morning with onset of left hemiscrotal pain and swelling.  Scrotal sonogram showed no evidence of torsion and he was treated for epididymo-orchitis.  He went back to the ED several hours later for increasing pain and a repeat sonogram showed no evidence of torsion.  He had a previous vasectomy October 2018.  Urinalysis was clear.   PMH: Past Medical History:  Diagnosis Date  . Migraines     Surgical History: Past Surgical History:  Procedure Laterality Date  . VASECTOMY      Home Medications:  Allergies as of 07/09/2018      Reactions   Pepto-bismol [bismuth Subsalicylate] Nausea And Vomiting      Medication List        Accurate as of 07/09/18  2:02 PM. Always use your most recent med list.          amoxicillin-clavulanate 875-125 MG tablet Commonly known as:  AUGMENTIN Take 1 tablet by mouth 2 (two) times daily.   doxycycline 100 MG capsule Commonly known as:  VIBRAMYCIN Take 1 capsule (100 mg total) by mouth 2 (two) times daily.   nabumetone 750 MG tablet Commonly known as:  RELAFEN Take 1 tablet (750 mg total) by mouth 2 (two) times daily.   oxyCODONE-acetaminophen 5-325 MG tablet Commonly known as:  PERCOCET/ROXICET Take 1 tablet by mouth every 4 (four) hours as needed for severe pain.       Allergies:  Allergies  Allergen Reactions  . Pepto-Bismol [Bismuth Subsalicylate] Nausea And Vomiting    Family History: Family History  Problem Relation Age of Onset  . Prostate cancer Neg Hx   . Bladder Cancer Neg Hx   . Kidney cancer Neg Hx     Social History:  reports that he has been smoking cigarettes. He has been smoking about  1.00 pack per day. His smokeless tobacco use includes chew. He reports that he drinks about 2.0 standard drinks of alcohol per week. He reports that he does not use drugs.  ROS: UROLOGY Frequent Urination?: No Hard to postpone urination?: No Burning/pain with urination?: No Get up at night to urinate?: No Leakage of urine?: No Urine stream starts and stops?: No Trouble starting stream?: No Do you have to strain to urinate?: No Blood in urine?: No Urinary tract infection?: No Sexually transmitted disease?: No Injury to kidneys or bladder?: No Painful intercourse?: No Weak stream?: No Erection problems?: No Penile pain?: No  Gastrointestinal Nausea?: No Vomiting?: No Indigestion/heartburn?: No Diarrhea?: No Constipation?: No  Constitutional Fever: No Night sweats?: No Weight loss?: No Fatigue?: No  Skin Skin rash/lesions?: No Itching?: No  Eyes Blurred vision?: No Double vision?: No  Ears/Nose/Throat Sore throat?: No Sinus problems?: No  Hematologic/Lymphatic Swollen glands?: No Easy bruising?: No  Cardiovascular Leg swelling?: No Chest pain?: No  Respiratory Cough?: No Shortness of breath?: No  Endocrine Excessive thirst?: No  Musculoskeletal Back pain?: No Joint pain?: No  Neurological Headaches?: No Dizziness?: No  Psychologic Depression?: No Anxiety?: No  Physical Exam: BP (!) 148/94 (BP Location: Left Arm, Patient Position: Sitting, Cuff Size: Normal)   Pulse 72   Ht 5\' 9"  (1.753 m)   Wt 185 lb  6.4 oz (84.1 kg)   BMI 27.38 kg/m   Constitutional:  Alert and oriented, No acute distress. HEENT: [place] AT, moist mucus membranes.  Trachea midline, no masses. Cardiovascular: No clubbing, cyanosis, or edema. Respiratory: Normal respiratory effort, no increased work of breathing. GI: Abdomen is soft, nontender, nondistended, no abdominal masses GU: No CVA tenderness.  Phallus without lesions.  Testes descended bilateral without masses or  tenderness.  There is tenderness and an indurated, tender area on the left globus minor of the epididymis. Lymph: No cervical or inguinal lymphadenopathy. Skin: No rashes, bruises or suspicious lesions. Neurologic: Grossly intact, no focal deficits, moving all 4 extremities. Psychiatric: Normal mood and affect.   Assessment & Plan:   32 year old male with a postvasectomy epididymitis.  This will be noninfectious.  He may discontinue the doxycycline.  Rx etodolac was sent to his pharmacy.  Follow-up as needed for increasing pain or swelling.    Riki AltesScott C Stoioff, MD  [facility] [address], [address] [place], [place] [id number] [phone]

## 2018-07-13 ENCOUNTER — Telehealth: Payer: Self-pay | Admitting: Urology

## 2018-07-13 NOTE — Telephone Encounter (Signed)
error 

## 2018-07-16 ENCOUNTER — Encounter: Payer: Self-pay | Admitting: Urology

## 2018-07-16 DIAGNOSIS — N451 Epididymitis: Secondary | ICD-10-CM | POA: Insufficient documentation

## 2019-01-01 IMAGING — DX DG HAND COMPLETE 3+V*R*
3 series · 3 of 3 positions shown · non-contrast
Comparison: Right hand radiographs 11/02/2009

CLINICAL DATA: Injury.  Pain.  Swelling.

EXAM:
RIGHT HAND - COMPLETE 3+ VIEW

[hand ap]
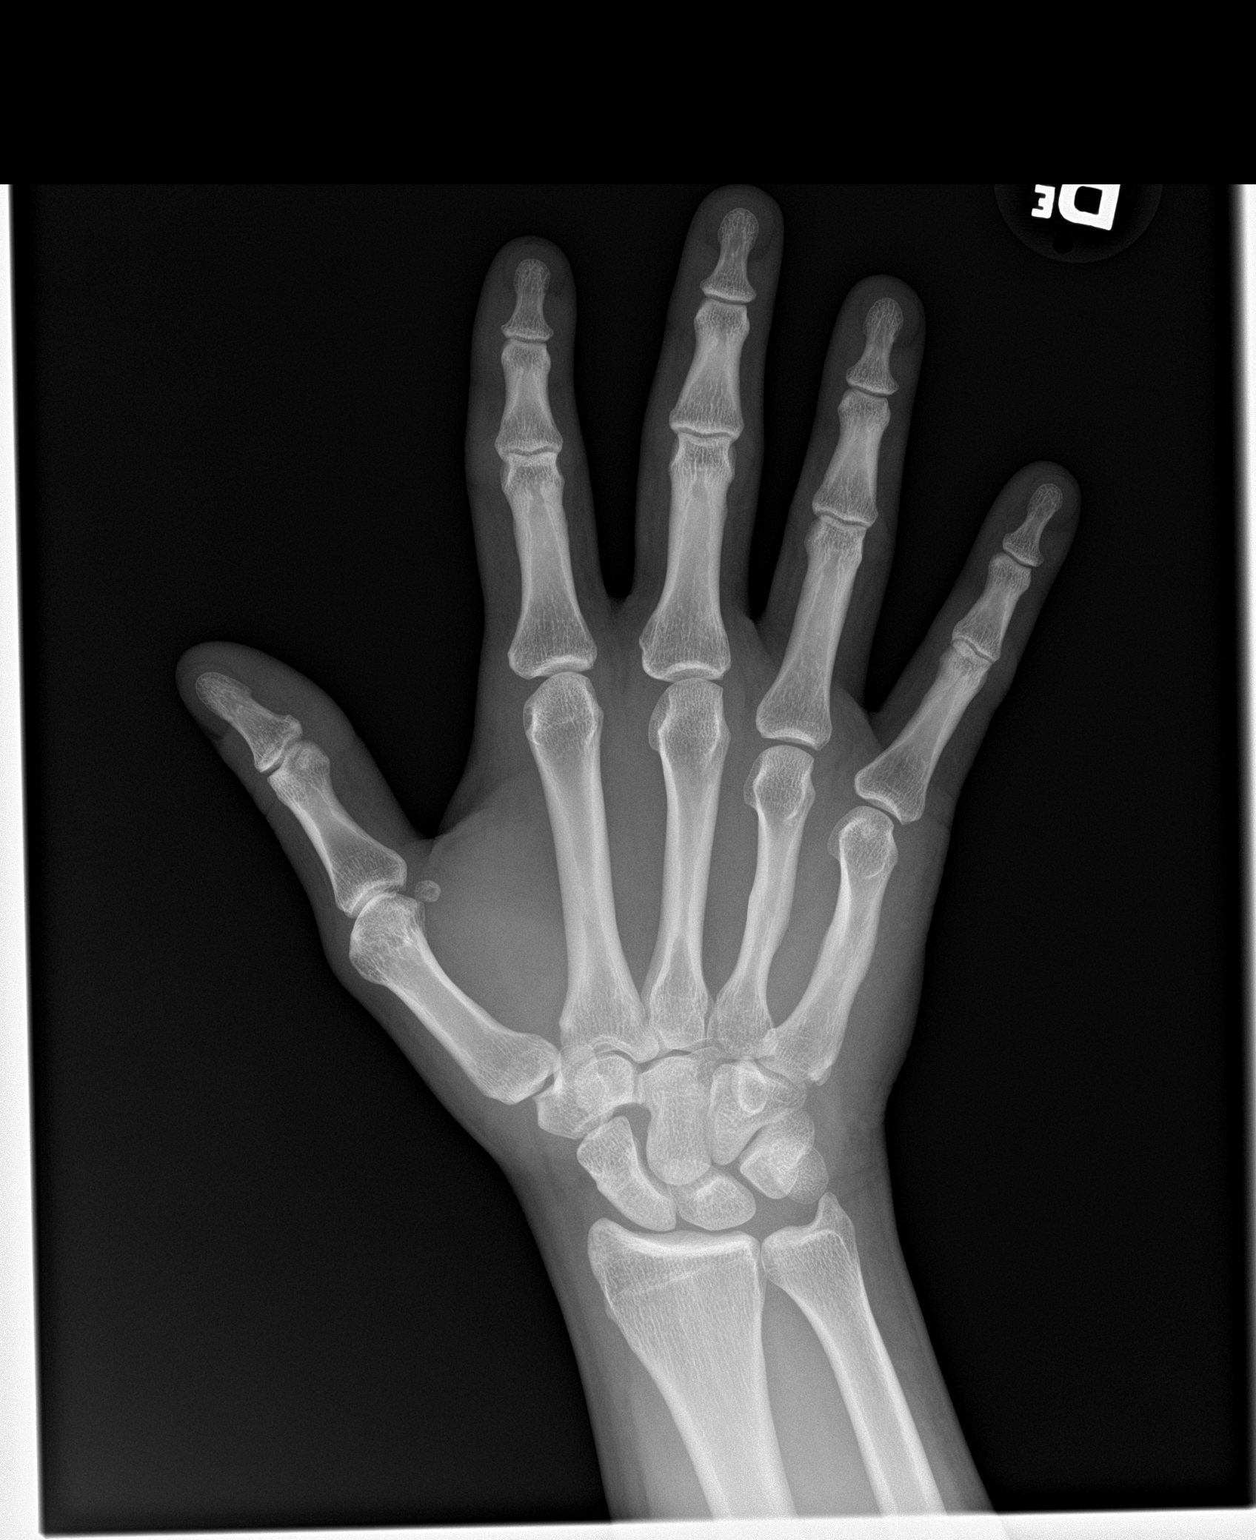

[hand obl]
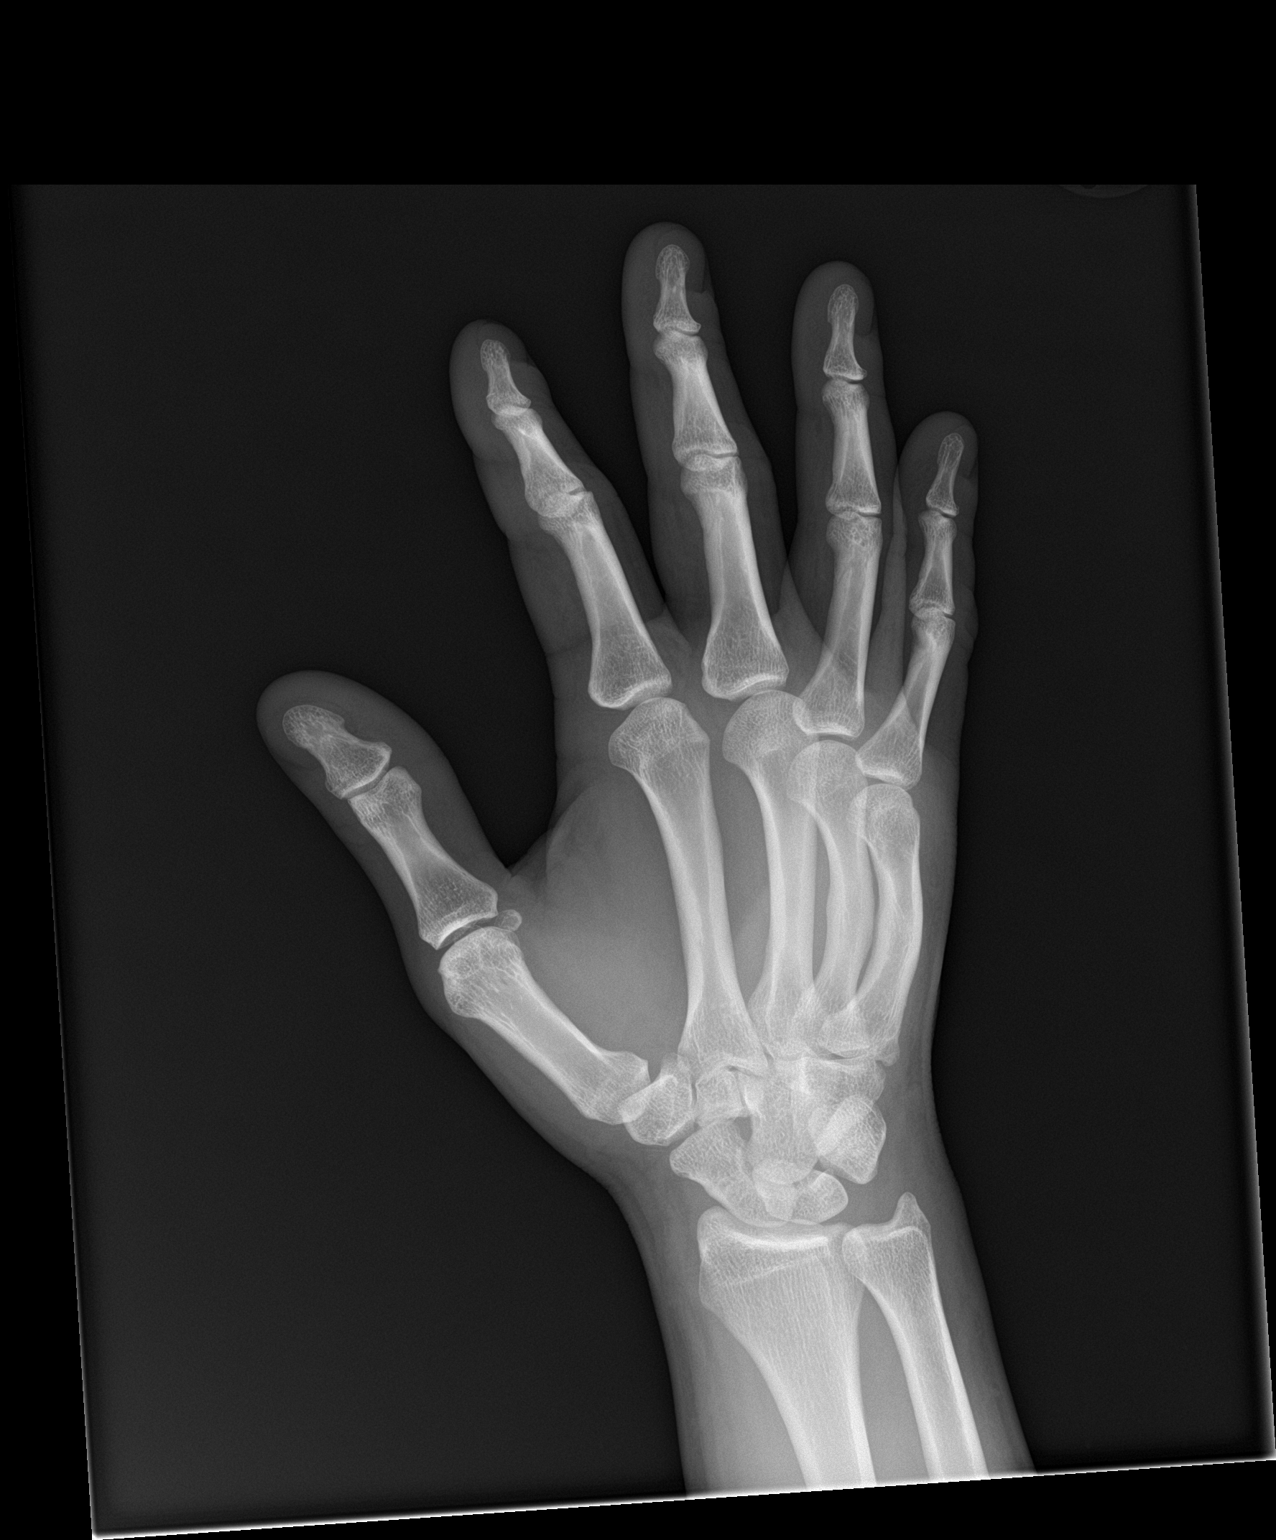

[hand lat]
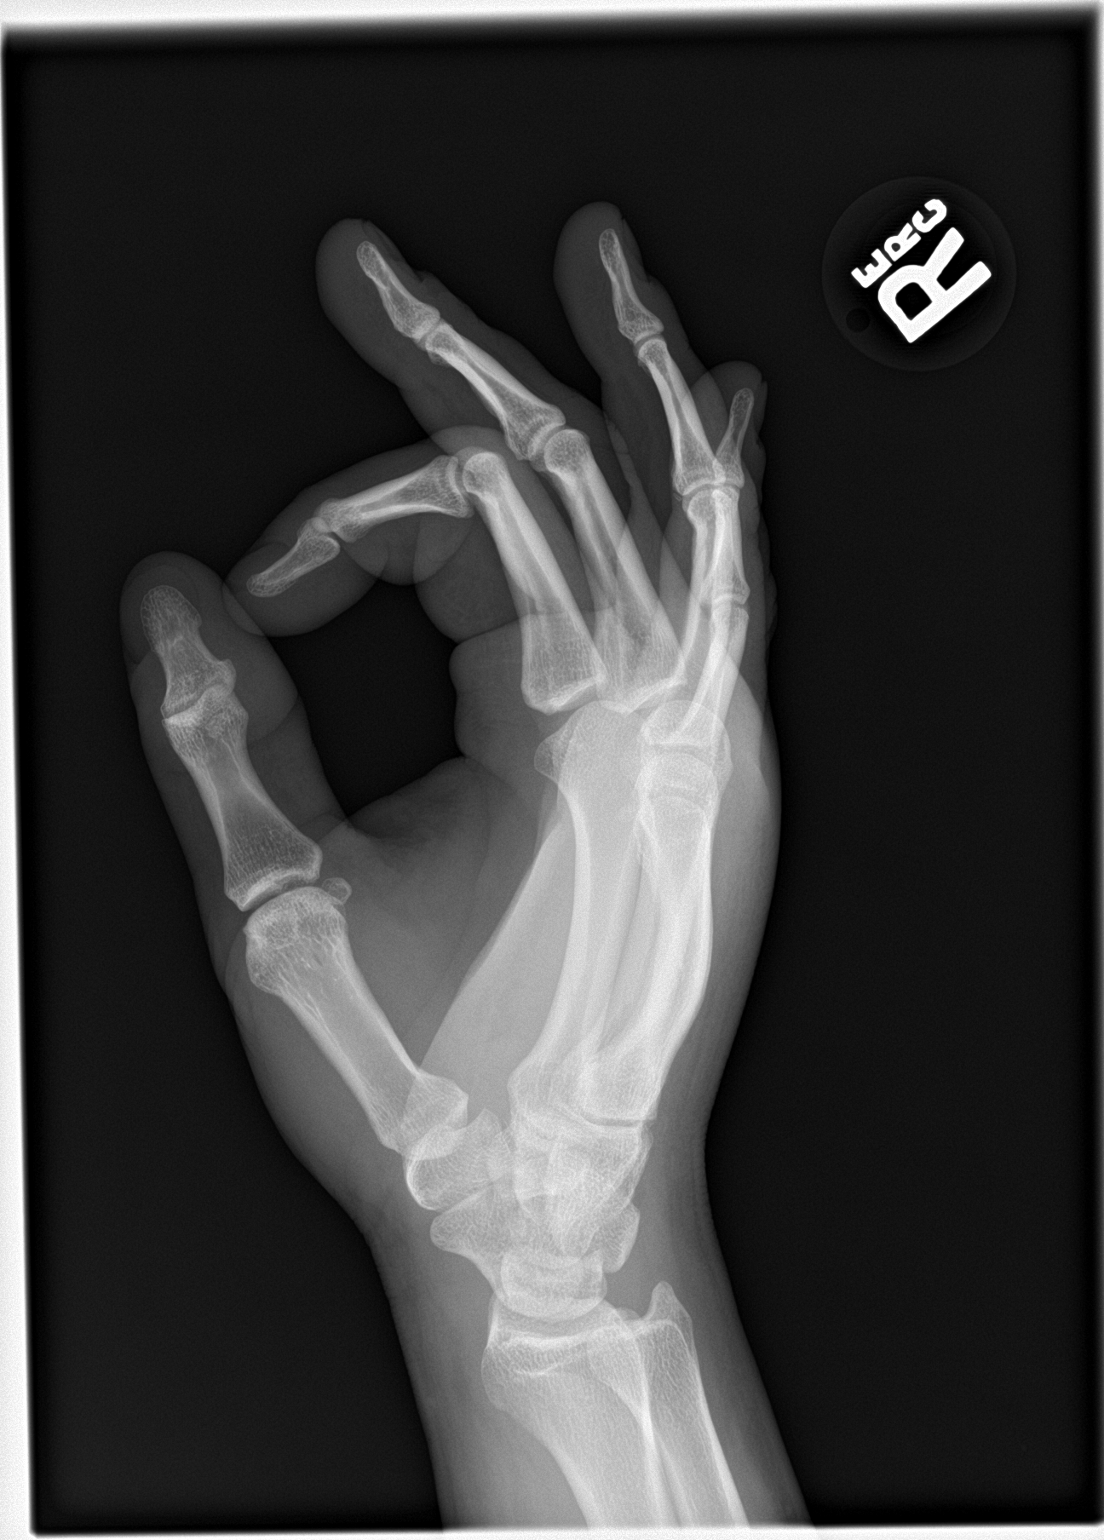

[3 of 3 positions shown; findings below may reference images not displayed]

FINDINGS: Soft tissue swelling is present over the dorsum of the hand. No
radiopaque foreign body is present. Healed fourth and fifth
metacarpal fractures are noted.
IMPRESSION: 1. Soft tissue swelling over the dorsum of the hand without
underlying fracture or radiopaque foreign body. This may represent
hematoma or cellulitis.
2. Healed fourth and fifth metacarpal fractures.

## 2019-01-03 IMAGING — DX DG HAND COMPLETE 3+V*R*
3 series · 3 of 3 positions shown · non-contrast
Comparison: Right hand series 05/27/2018.

CLINICAL DATA: 31-year-old male with pain and swelling after blunt
trauma, slammed hand in [place] lower at work today.

EXAM:
RIGHT HAND - COMPLETE 3+ VIEW

[hand ap]
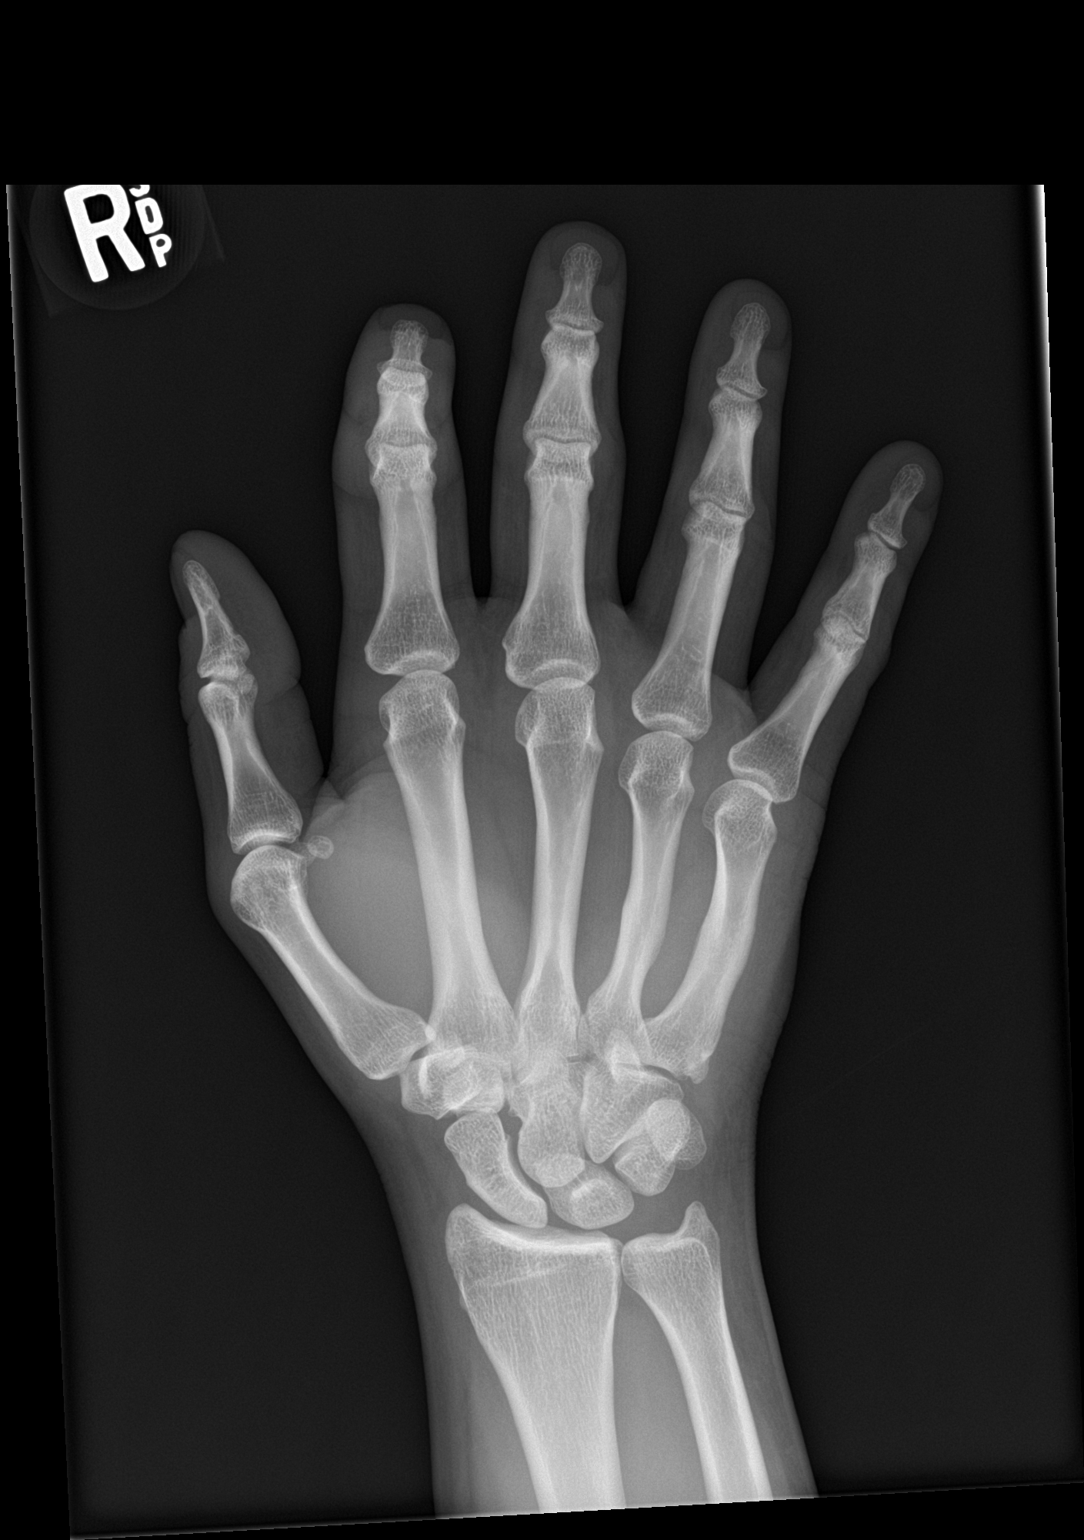

[hand obl]
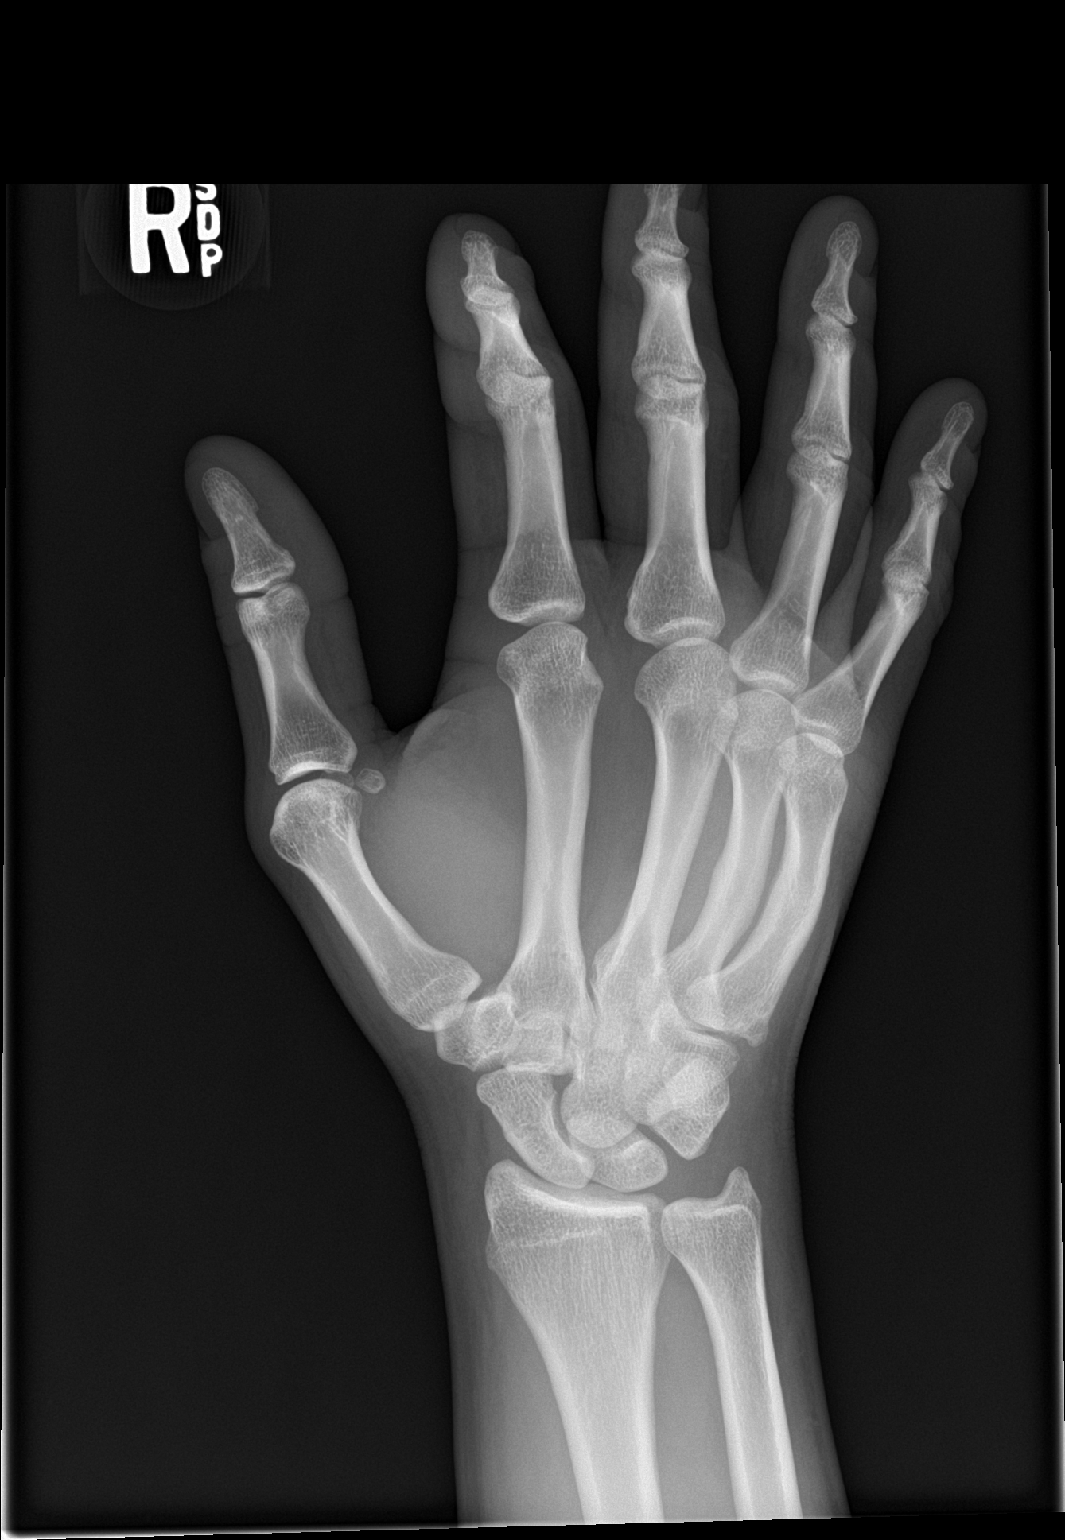

[hand lat]
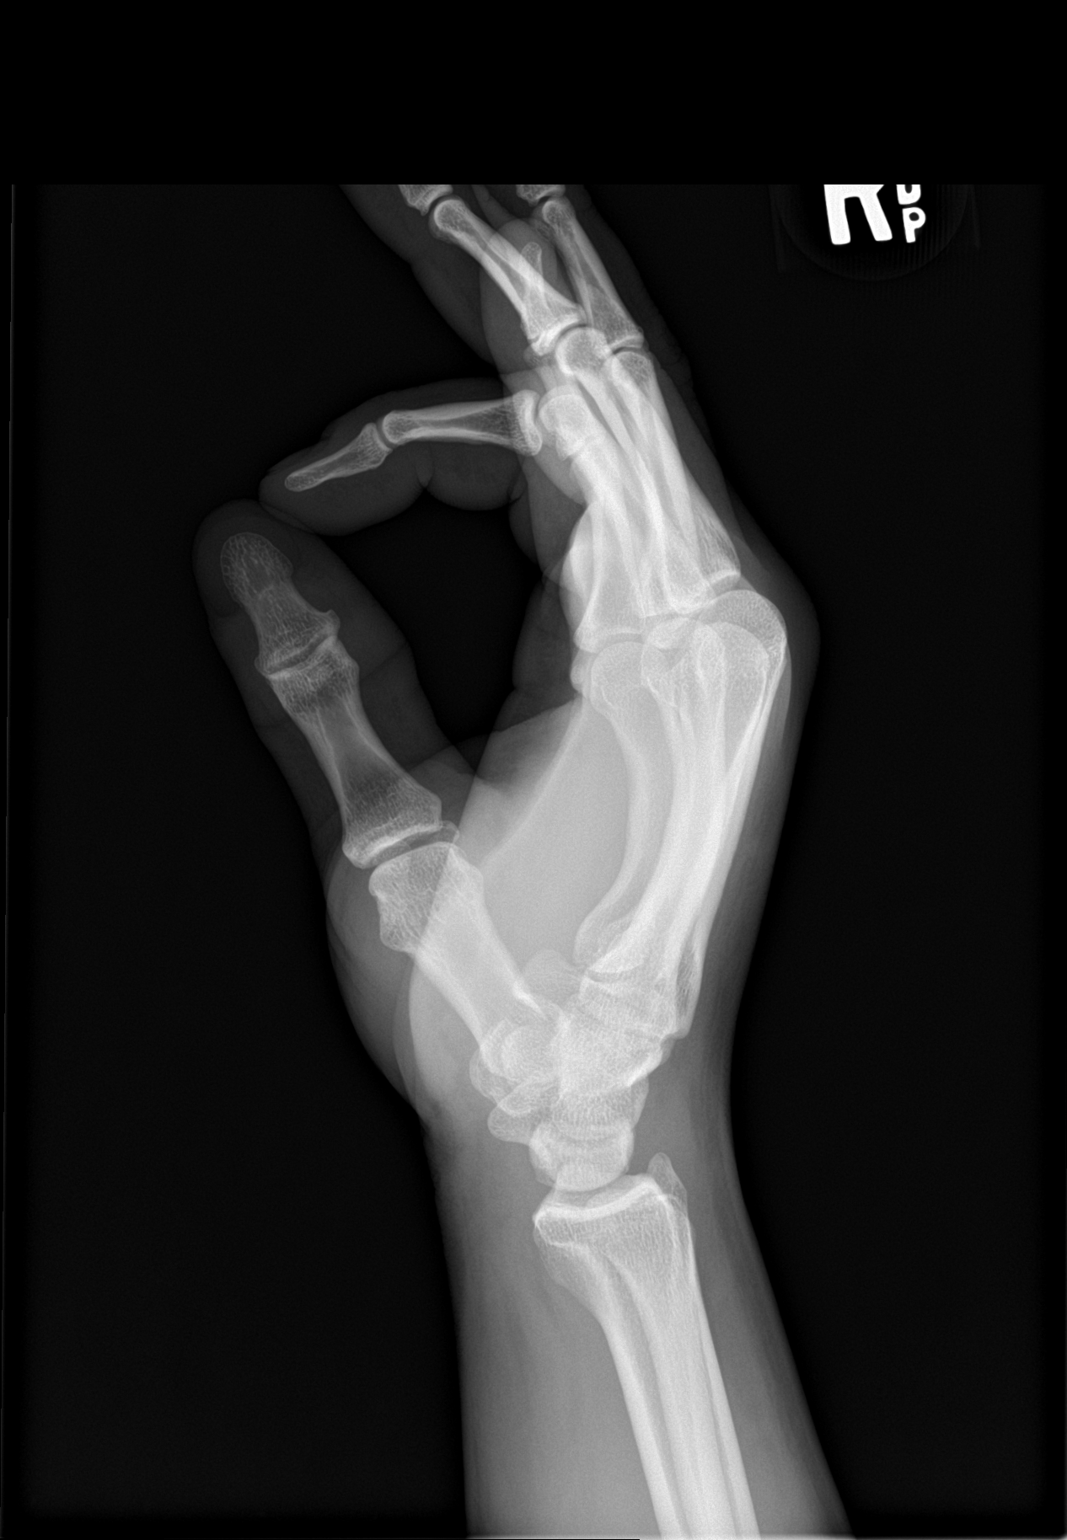

[3 of 3 positions shown; findings below may reference images not displayed]

FINDINGS: Bone mineralization is within normal limits. Healed chronic 4th and
5th metacarpal fracture redemonstrated. The distal radius, ulna, and
carpal bones remain normal. Normal joint spaces and alignment. No
acute fracture or dislocation. Right hand soft tissue swelling
appears decreased since 05/27/2018. No radiopaque foreign body
identified.
IMPRESSION: No acute osseous abnormality.

## 2019-02-12 IMAGING — US US ART/VEN ABD/PELV/SCROTUM DOPPLER LTD
1 series · 14 of 25 positions shown · non-contrast
Comparison: Scrotal ultrasound performed yesterday

CLINICAL DATA: Left testicular pain.

EXAM:
SCROTAL ULTRASOUND
DOPPLER ULTRASOUND OF THE TESTICLES
TECHNIQUE: Complete ultrasound examination of the testicles, epididymis, and
other scrotal structures was performed. Color and spectral Doppler
ultrasound were also utilized to evaluate blood flow to the
testicles.

[Series 1: us art/ven abd/pelv/scrotum doppler ltd · 14 of 75 slices shown]
[im 1/75]
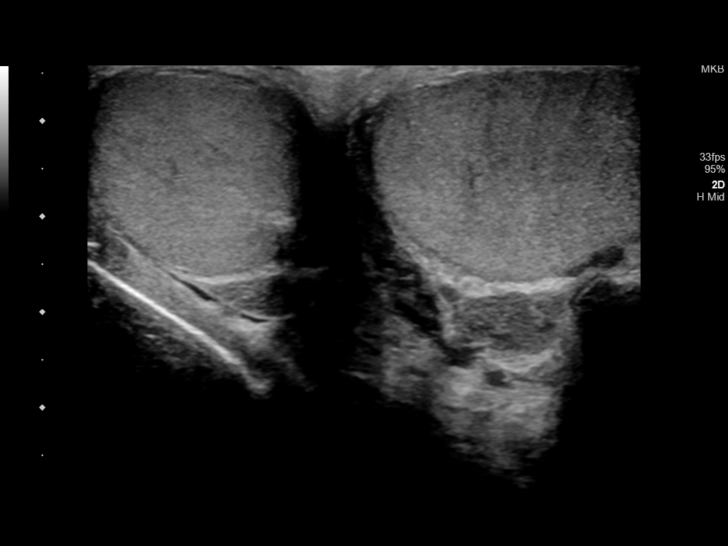
[im 7/75]
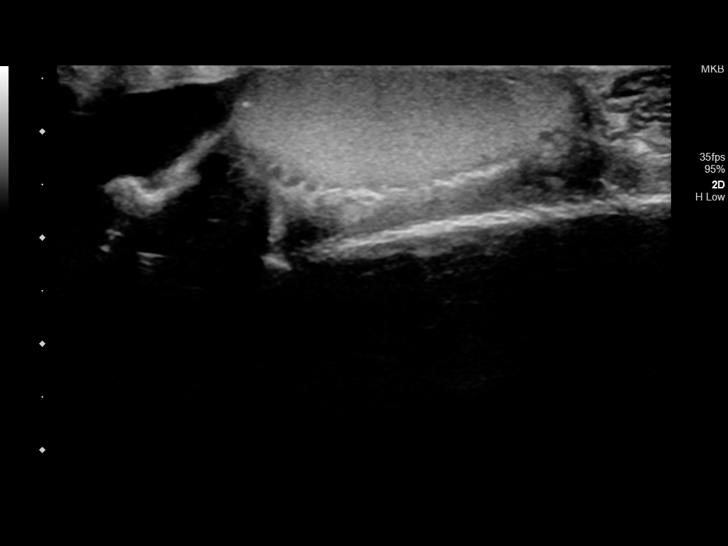
[im 13/75]
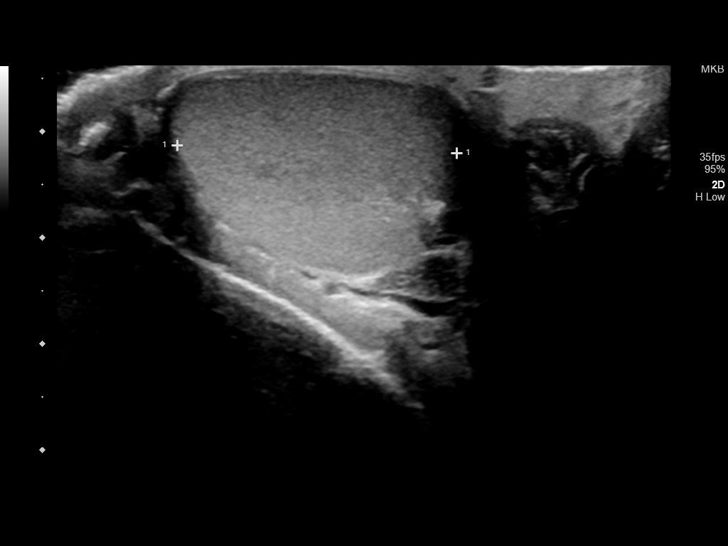
[im 19/75]
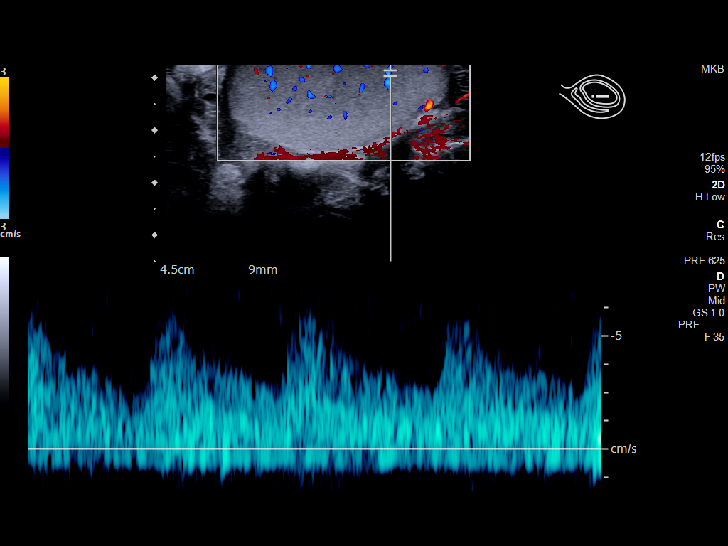
[im 25/75]
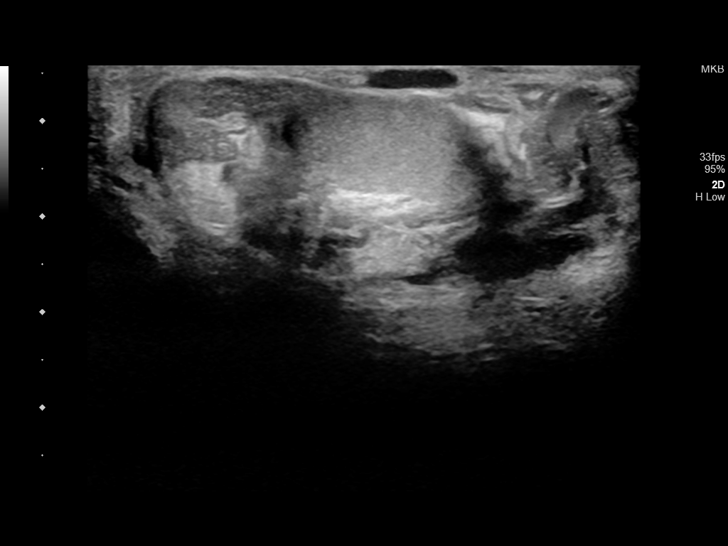
[im 28/75]
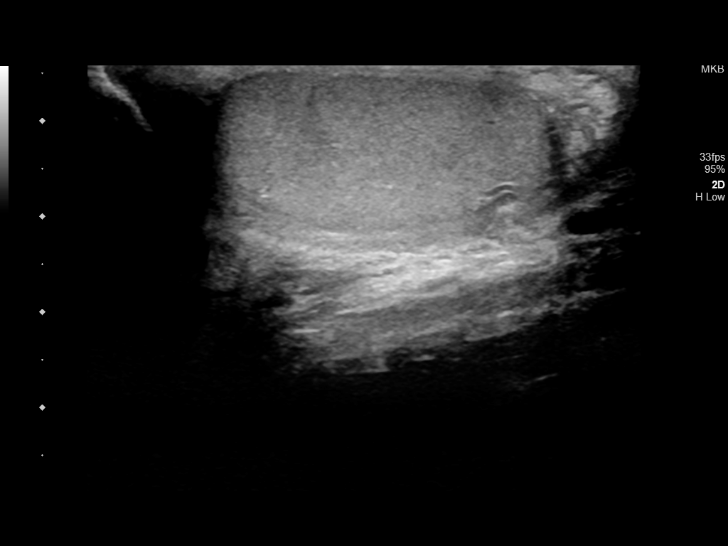
[im 34/75]
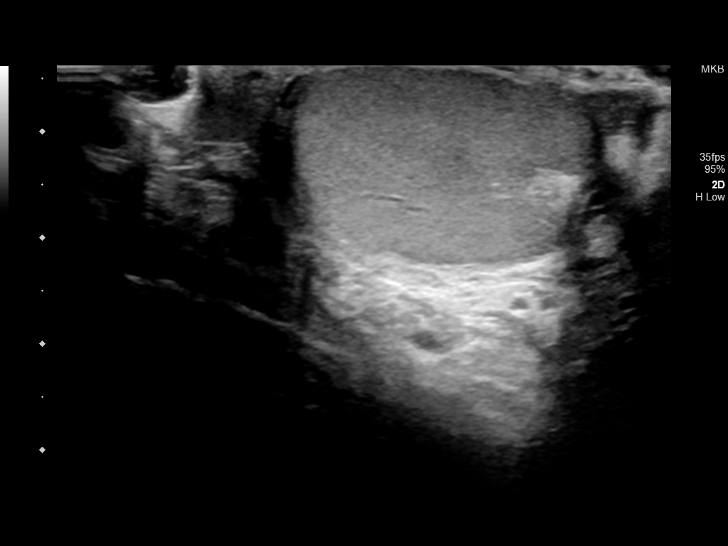
[im 41/75]
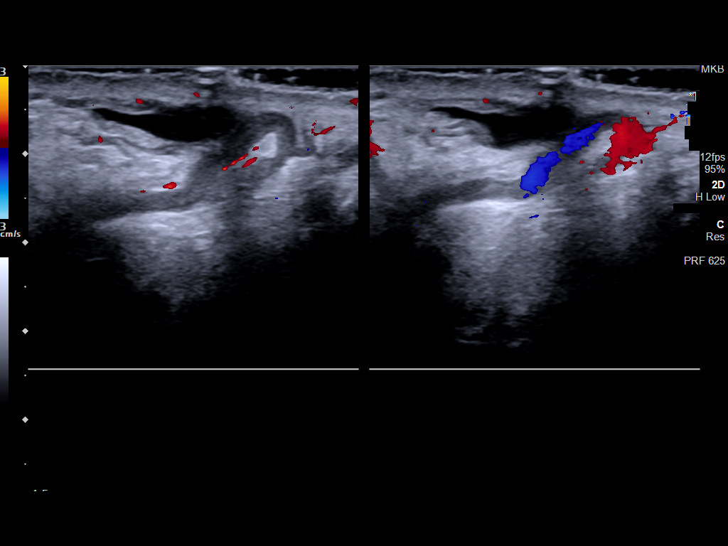
[im 47/75]
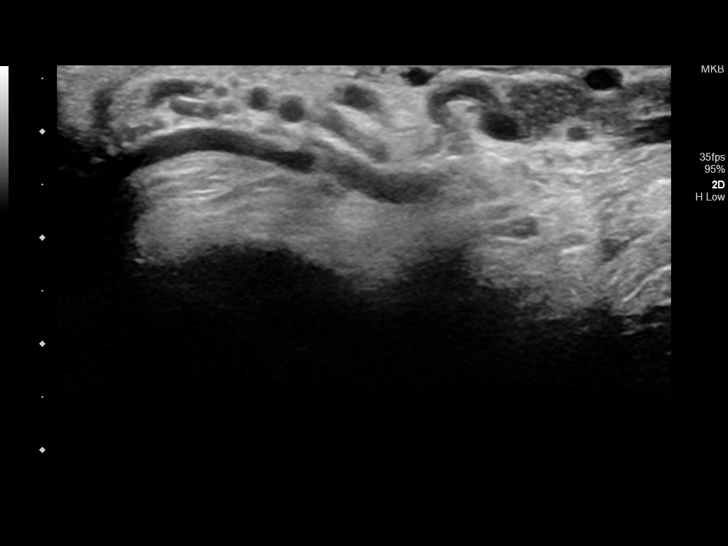
[im 50/75]
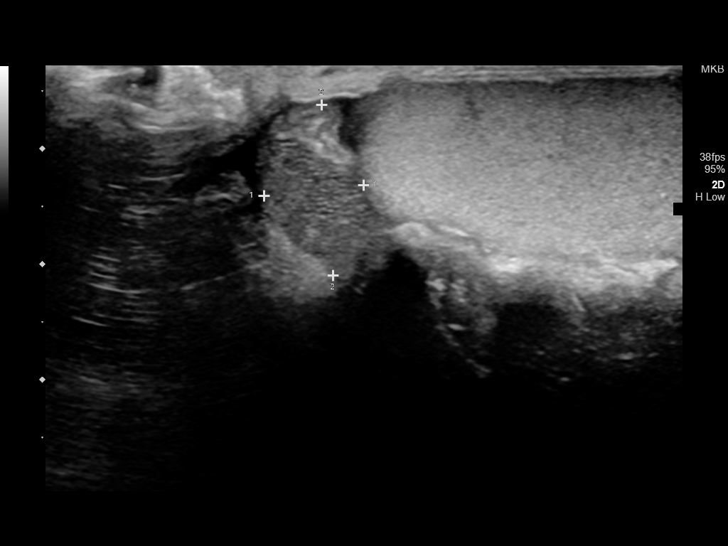
[im 56/75]
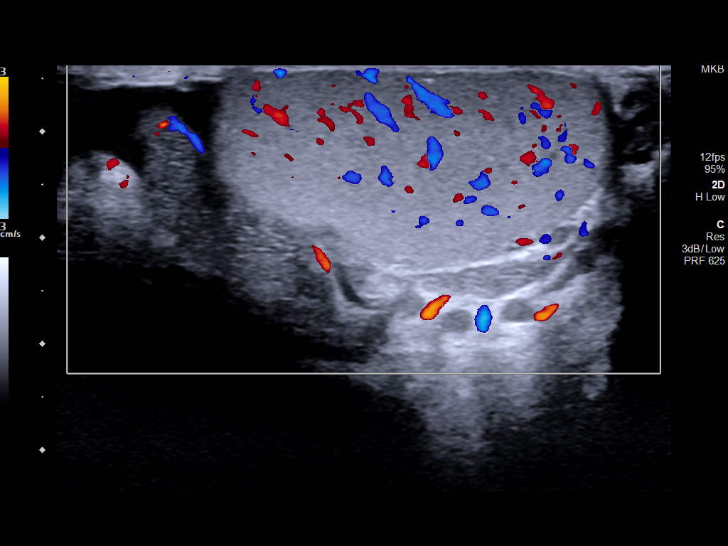
[im 62/75]
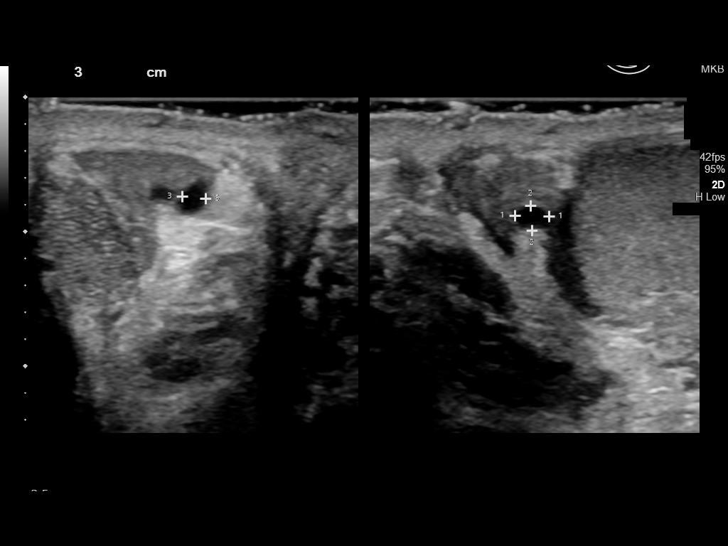
[im 68/75]
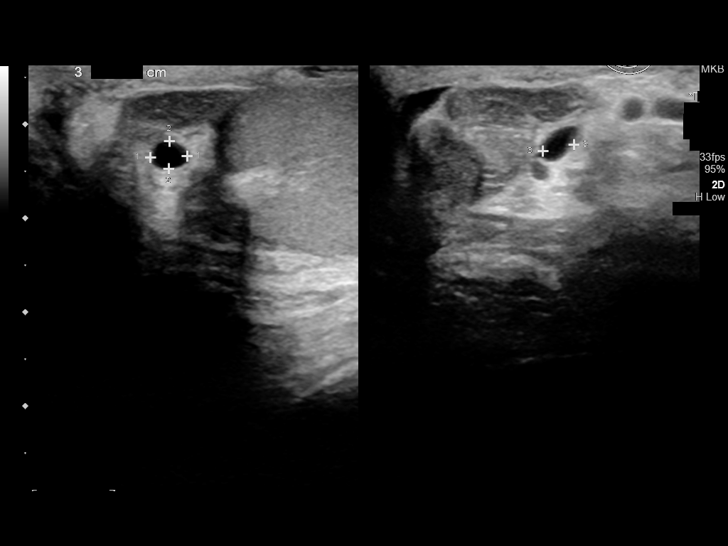
[im 75/75]
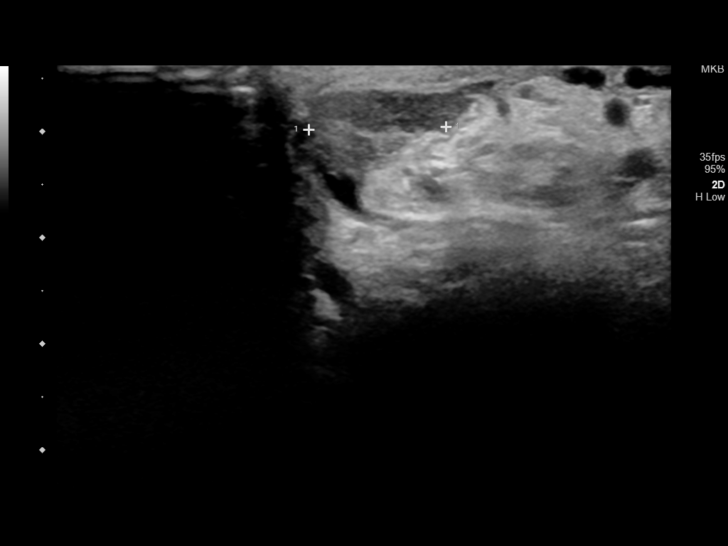

[14 of 25 positions shown; findings below may reference images not displayed]

FINDINGS: Right testicle

Measurements: 4.3 x 2.0 x 2.6 cm. Few scattered microcalcifications.
No mass.

Left testicle

Measurements: 4.2 x 2.0 x 2.8 cm. The previously seen
microcalcifications not well visualized on today's study. No mass.

Right epididymis:  Small cysts, the largest 2-3 mm.

Left epididymis:  Small cyst measuring 4 mm

Hydrocele:  Small right hydrocele

Varicocele:  Mild left varicocele.

Pulsed Doppler interrogation of both testes demonstrates normal low
resistance arterial and venous waveforms bilaterally.
IMPRESSION: No testicular abnormality.

Small bilateral epididymal cysts.

Small right hydrocele.

Mild left varicocele.

## 2019-10-23 ENCOUNTER — Ambulatory Visit: Payer: Medicaid Other | Attending: Internal Medicine

## 2019-10-23 DIAGNOSIS — Z20822 Contact with and (suspected) exposure to covid-19: Secondary | ICD-10-CM

## 2019-10-24 LAB — NOVEL CORONAVIRUS, NAA: SARS-CoV-2, NAA: DETECTED — AB

## 2020-04-20 ENCOUNTER — Other Ambulatory Visit: Payer: Self-pay

## 2020-04-20 ENCOUNTER — Emergency Department
Admission: EM | Admit: 2020-04-20 | Discharge: 2020-04-20 | Disposition: A | Discharge status: Home / Self Care | Payer: No Typology Code available for payment source | Attending: Emergency Medicine | Admitting: Emergency Medicine

## 2020-04-20 DIAGNOSIS — X58XXXA Exposure to other specified factors, initial encounter: Secondary | ICD-10-CM | POA: Insufficient documentation

## 2020-04-20 DIAGNOSIS — F17210 Nicotine dependence, cigarettes, uncomplicated: Secondary | ICD-10-CM | POA: Diagnosis not present

## 2020-04-20 DIAGNOSIS — Y939 Activity, unspecified: Secondary | ICD-10-CM | POA: Insufficient documentation

## 2020-04-20 DIAGNOSIS — Y929 Unspecified place or not applicable: Secondary | ICD-10-CM | POA: Insufficient documentation

## 2020-04-20 DIAGNOSIS — S8011XA Contusion of right lower leg, initial encounter: Secondary | ICD-10-CM

## 2020-04-20 DIAGNOSIS — Y999 Unspecified external cause status: Secondary | ICD-10-CM | POA: Insufficient documentation

## 2020-04-20 DIAGNOSIS — F17220 Nicotine dependence, chewing tobacco, uncomplicated: Secondary | ICD-10-CM | POA: Insufficient documentation

## 2020-04-20 DIAGNOSIS — S7011XA Contusion of right thigh, initial encounter: Secondary | ICD-10-CM | POA: Diagnosis not present

## 2020-04-20 NOTE — ED Triage Notes (Addendum)
Pt arrives to ED via POV from home with c/o "unknown" bruise to the inner upper right thigh x1 day. No known injury or trauma. Pt is A&O, in NAD; RR even, regular, and unlabored. Pt reports family member with a recent d/x of a "blood clot" and would like top be examined for same.

## 2020-04-20 NOTE — Discharge Instructions (Signed)
Change knee braces as discussed.  You may notice the area increases in size and will likely take a couple of weeks to go away completely.

## 2020-04-20 NOTE — ED Provider Notes (Signed)
[facility] Emergency Department Provider Note  ____________________________________________  Time seen: Approximately 11:28 PM  I have reviewed the triage vital signs and the nursing notes.   HISTORY  Chief Complaint Leg Injury   HPI Jerry Henson is a 34 y.o. male presents to the emergency department for evaluation of a bruised area on the right lower extremity that came up suddenly this afternoon.  No injury.  Area is not tender.  He is concerned that it could be "something serious" after researching online.   Past Medical History:  Diagnosis Date  . Migraines     Patient Active Problem List   Diagnosis Date Noted  . Epididymitis 07/16/2018    Past Surgical History:  Procedure Laterality Date  . VASECTOMY      Prior to Admission medications   Medication Sig Start Date End Date Taking? Authorizing Provider  amoxicillin-clavulanate (AUGMENTIN) 875-125 MG tablet Take 1 tablet by mouth 2 (two) times daily. 05/29/18   Fisher, Roselyn Bering, PA-C  doxycycline (VIBRAMYCIN) 100 MG capsule Take 1 capsule (100 mg total) by mouth 2 (two) times daily. 07/08/18   Schaevitz, Myra Rude, MD  etodolac (LODINE) 400 MG tablet Take 1 tablet (400 mg total) by mouth 2 (two) times daily as needed for moderate pain. 07/09/18   Stoioff, Verna Czech, MD  nabumetone (RELAFEN) 750 MG tablet Take 1 tablet (750 mg total) by mouth 2 (two) times daily. 05/27/18   Menshew, Charlesetta Ivory, PA-C  oxyCODONE-acetaminophen (PERCOCET) 5-325 MG tablet Take 1 tablet by mouth every 4 (four) hours as needed for severe pain. 07/08/18   Jeanmarie Plant, MD    Allergies Pepto-bismol [bismuth subsalicylate]  Family History  Problem Relation Age of Onset  . Prostate cancer Neg Hx   . Bladder Cancer Neg Hx   . Kidney cancer Neg Hx     Social History Social History   Tobacco Use  . Smoking status: Current Some Day Smoker    Packs/day: 1.00    Types: Cigarettes  . Smokeless tobacco: Current  User    Types: Chew  Vaping Use  . Vaping Use: Never used  Substance Use Topics  . Alcohol use: Yes    Alcohol/week: 2.0 standard drinks    Types: 2 Cans of beer per week  . Drug use: Never    Review of Systems  Constitutional: Negative for fever. Respiratory: Negative for cough or shortness of breath.  Musculoskeletal: Negative for myalgias Skin: Positive for bruising to right leg. Neurological: Negative for numbness or paresthesias. ____________________________________________   PHYSICAL EXAM:  VITAL SIGNS: ED Triage Vitals  Enc Vitals Group     BP 04/20/20 2121 131/79     Pulse Rate 04/20/20 2121 71     Resp 04/20/20 2121 14     Temp 04/20/20 2121 98.2 F (36.8 C)     Temp Source 04/20/20 2121 Oral     SpO2 04/20/20 2121 99 %     Weight 04/20/20 2122 180 lb (81.6 kg)     Height 04/20/20 2122 5\' 9"  (1.753 m)     Head Circumference --      Peak Flow --      Pain Score 04/20/20 2122 0     Pain Loc --      Pain Edu? --      Excl. in GC? --      Constitutional: Well appearing. Eyes: Conjunctivae are clear without discharge or drainage. Nose: No rhinorrhea noted. Mouth/Throat: Airway is patent.  Neck: No stridor. Unrestricted range of motion observed. Cardiovascular: Capillary refill is <3 seconds.  Respiratory: Respirations are even and unlabored.. Musculoskeletal: Unrestricted range of motion observed. Neurologic: Awake, alert, and oriented x 4.  Skin:  Hematoma overlying medial aspect of right thigh above knee.  ____________________________________________   LABS (all labs ordered are listed, but only abnormal results are displayed)  Labs Reviewed - No data to display ____________________________________________  EKG  Not indicated. ____________________________________________  RADIOLOGY  Not indicated. ____________________________________________   PROCEDURES  Procedures ____________________________________________   INITIAL IMPRESSION  / ASSESSMENT AND PLAN / ED COURSE  Jerry Henson is a 34 y.o. male presents to the emergency department for treatment and evaluation of a concerning bruise that suddenly appeared this afternoon.  See HPI for further details.  Patient recalls that he has been wearing his knee brace off and on but has not worn it in the last 3 days.  The area of concern is directly in line with the metal braces inside the knee brace.  Area is nontender.  Patient was reassured.  He is to change knee braces.  If he continues to develop bruises for unknown cause, he is to see primary care.  He is to return to the emergency department for symptoms change or worsen if unable to schedule appointment.  Medications - No data to display   Pertinent labs & imaging results that were available during my care of the patient were reviewed by me and considered in my medical decision making (see chart for details).  ____________________________________________   FINAL CLINICAL IMPRESSION(S) / ED DIAGNOSES  Final diagnoses:  Hematoma of leg, right, initial encounter    ED Discharge Orders    None       Note:  This document was prepared using Dragon voice recognition software and may include unintentional dictation errors.   Chinita Pester, FNP 04/20/20 0454    Sharyn Creamer, MD 04/20/20 2336

## 2020-05-28 ENCOUNTER — Encounter: Payer: Self-pay | Admitting: Emergency Medicine

## 2020-05-28 ENCOUNTER — Other Ambulatory Visit: Payer: Self-pay

## 2020-05-28 ENCOUNTER — Emergency Department
Admission: EM | Admit: 2020-05-28 | Discharge: 2020-05-28 | Disposition: A | Discharge status: Home / Self Care | Payer: No Typology Code available for payment source | Attending: Emergency Medicine | Admitting: Emergency Medicine

## 2020-05-28 DIAGNOSIS — F17210 Nicotine dependence, cigarettes, uncomplicated: Secondary | ICD-10-CM | POA: Diagnosis not present

## 2020-05-28 DIAGNOSIS — E860 Dehydration: Secondary | ICD-10-CM | POA: Insufficient documentation

## 2020-05-28 DIAGNOSIS — R42 Dizziness and giddiness: Secondary | ICD-10-CM | POA: Insufficient documentation

## 2020-05-28 DIAGNOSIS — T675XXA Heat exhaustion, unspecified, initial encounter: Secondary | ICD-10-CM | POA: Diagnosis not present

## 2020-05-28 LAB — BASIC METABOLIC PANEL
Anion gap: 11 (ref 5–15)
BUN: 19 mg/dL (ref 6–20)
CO2: 26 mmol/L (ref 22–32)
Calcium: 9.5 mg/dL (ref 8.9–10.3)
Chloride: 100 mmol/L (ref 98–111)
Creatinine, Ser: 0.87 mg/dL (ref 0.61–1.24)
GFR calc Af Amer: >60 mL/min (ref >60)
GFR calc non Af Amer: >60 mL/min (ref >60)
Glucose, Bld: 96 mg/dL (ref 70–99)
Potassium: 3.9 mmol/L (ref 3.5–5.1)
Sodium: 137 mmol/L (ref 135–145)

## 2020-05-28 LAB — CBC
HCT: 44.7 % (ref 39.0–52.0)
Hemoglobin: 16.4 g/dL (ref 13.0–17.0)
MCH: 32.8 pg (ref 26.0–34.0)
MCHC: 36.7 g/dL — ABNORMAL HIGH (ref 30.0–36.0)
MCV: 89.4 fL (ref 80.0–100.0)
Platelets: 216 10*3/uL (ref 150–400)
RBC: 5 MIL/uL (ref 4.22–5.81)
RDW: 11.9 % (ref 11.5–15.5)
WBC: 7.4 10*3/uL (ref 4.0–10.5)
nRBC: 0 % (ref 0.0–0.2)

## 2020-05-28 LAB — URINALYSIS, COMPLETE (UACMP) WITH MICROSCOPIC
Bacteria, UA: NONE SEEN
Bilirubin Urine: NEGATIVE
Glucose, UA: NEGATIVE mg/dL
Hgb urine dipstick: NEGATIVE
Ketones, ur: NEGATIVE mg/dL
Leukocytes,Ua: NEGATIVE
Nitrite: NEGATIVE
Protein, ur: 30 mg/dL — AB
Specific Gravity, Urine: 1.026 (ref 1.005–1.030)
Squamous Epithelial / LPF: NONE SEEN (ref 0–5)
pH: 7 (ref 5.0–8.0)

## 2020-05-28 MED ORDER — METOCLOPRAMIDE HCL 10 MG PO TABS
10.0000 mg | ORAL_TABLET | Freq: Once | ORAL | Status: AC
  Administered 2020-05-28: 10 mg via ORAL
  Filled 2020-05-28: qty 1

## 2020-05-28 MED ORDER — IBUPROFEN 600 MG PO TABS
600.0000 mg | ORAL_TABLET | Freq: Once | ORAL | Status: AC
  Administered 2020-05-28: 600 mg via ORAL
  Filled 2020-05-28: qty 1

## 2020-05-28 NOTE — ED Triage Notes (Signed)
C/O feeling dizzy and dehydrated 'for a while'.    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  AAOx3.  Skin warm and dry. NAD

## 2020-05-28 NOTE — ED Notes (Signed)
See triage note  Presents with dizziness   States he developed sx's sudden onset while working  States sx's have eased off except for "massive h/a"

## 2020-05-28 NOTE — ED Provider Notes (Signed)
[facility] Emergency Department Provider Note  ____________________________________________   First MD Initiated Contact with Patient 05/28/20 1633     (approximate)  I have reviewed the triage vital signs and the nursing notes.   HISTORY  Chief Complaint Dizziness    HPI Jerry Henson is a 34 y.o. male with history of migraines here with near syncopal episode while at work.  Patient works as a Music therapist.  He was working in the heat this morning.  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  He felt like his body was overheating.  He began to feel very sweaty and broke out in a sweat.  He thought was possibly heat related so he went back to his car and sat in air conditioning, which did not significant improve his symptoms.  He is able to drive several minutes home then told his significant other to bring him here.  He now feels somewhat better, though he does endorse a moderate, generalized headache.  Has a history of migraines and also states he has not had much to eat or drink since being in the waiting room.  Denies any headache prior to the episode.  The headache is similar to his usual, no acute onset.  No fevers or chills.  No recent medication changes.  Of note, he does drink energy drinks and did have 2 of these today, as well as recently began vaping more than smoking cigarettes.        Past Medical History:  Diagnosis Date  . Migraines     Patient Active Problem List   Diagnosis Date Noted  . Epididymitis 07/16/2018    Past Surgical History:  Procedure Laterality Date  . VASECTOMY      Prior to Admission medications   Not on File    Allergies Pepto-bismol [bismuth subsalicylate]  Family History  Problem Relation Age of Onset  . Prostate cancer Neg Hx   . Bladder Cancer Neg Hx   . Kidney cancer Neg Hx     Social History Social History    Tobacco Use  . Smoking status: Current Some Day Smoker    Packs/day: 1.00    Types: Cigarettes  . Smokeless tobacco: Current User    Types: Chew  Vaping Use  . Vaping Use: Never used  Substance Use Topics  . Alcohol use: Yes    Alcohol/week: 2.0 standard drinks    Types: 2 Cans of beer per week  . Drug use: Never    Review of Systems  Review of Systems  Constitutional: Positive for fatigue. Negative for chills and fever.  HENT: Negative for sore throat.   Respiratory: Negative for shortness of breath.   Cardiovascular: Negative for chest pain.  Gastrointestinal: Negative for abdominal pain.  Genitourinary: Negative for flank pain.  Musculoskeletal: Negative for neck pain.  Skin: Negative for rash and wound.  Allergic/Immunologic: Negative for immunocompromised state.  Neurological: Positive for dizziness and light-headedness. Negative for weakness and numbness.  Hematological: Does not bruise/bleed easily.  All other systems reviewed and are negative.    ____________________________________________  PHYSICAL EXAM:      VITAL SIGNS: ED Triage Vitals  Enc Vitals Group     BP 05/28/20 1324 118/74     Pulse Rate 05/28/20 1324 63     Resp 05/28/20 1324 16     Temp 05/28/20 1324 98.2 F (36.8  C)     Temp Source 05/28/20 1324 Oral     SpO2 05/28/20 1324 98 %     Weight 05/28/20 1218 179 lb 14.3 oz (81.6 kg)     Height 05/28/20 1218 5\' 9"  (1.753 m)     Head Circumference --      Peak Flow --      Pain Score 05/28/20 1218 0     Pain Loc --      Pain Edu? --      Excl. in GC? --      Physical Exam Vitals and nursing note reviewed.  Constitutional:      General: He is not in acute distress.    Appearance: He is well-developed.  HENT:     Head: Normocephalic and atraumatic.     Mouth/Throat:     Mouth: Mucous membranes are dry.  Eyes:     Conjunctiva/sclera: Conjunctivae normal.  Cardiovascular:     Rate and Rhythm: Normal rate and regular rhythm.      Heart sounds: Normal heart sounds. No murmur heard.  No friction rub.  Pulmonary:     Effort: Pulmonary effort is normal. No respiratory distress.     Breath sounds: Normal breath sounds. No wheezing or rales.  Abdominal:     General: There is no distension.     Palpations: Abdomen is soft.     Tenderness: There is no abdominal tenderness.  Musculoskeletal:     Cervical back: Neck supple.  Skin:    General: Skin is warm.     Capillary Refill: Capillary refill takes less than 2 seconds.  Neurological:     Mental Status: He is alert and oriented to person, place, and time.     GCS: GCS eye subscore is 4. GCS verbal subscore is 5. GCS motor subscore is 6.     Cranial Nerves: Cranial nerves are intact.     Sensory: Sensation is intact.     Motor: Motor function is intact. No abnormal muscle tone.     Coordination: Coordination is intact.     Gait: Gait is intact.       ____________________________________________   LABS (all labs ordered are listed, but only abnormal results are displayed)  Labs Reviewed  CBC - Abnormal; Notable for the following components:      Result Value   MCHC 36.7 (*)    All other components within normal limits  URINALYSIS, COMPLETE (UACMP) WITH MICROSCOPIC - Abnormal; Notable for the following components:   Color, Urine AMBER (*)    APPearance CLOUDY (*)    Protein, ur 30 (*)    All other components within normal limits  BASIC METABOLIC PANEL  CBG MONITORING, ED    ____________________________________________  EKG: Normal sinus rhythm, ventricular rate 63.  PR 130, QRS 90, QTc 392.  No acute ST elevations or depressions.  No EKG evidence of acute ischemia or infarct. ________________________________________  RADIOLOGY All imaging, including plain films, CT scans, and ultrasounds, independently reviewed by me, and interpretations confirmed via formal radiology reads.  ED MD interpretation:   None  Official radiology report(s): No results  found.  ____________________________________________  PROCEDURES   Procedure(s) performed (including Critical Care):  Procedures  ____________________________________________  INITIAL IMPRESSION / MDM / ASSESSMENT AND PLAN / ED COURSE  As part of my medical decision making, I reviewed the following data within the electronic MEDICAL RECORD NUMBER Nursing notes reviewed and incorporated, Old chart reviewed, Notes from prior ED visits, and [place] Controlled  Substance Database       *EAN GETTEL was evaluated in Emergency Department on 05/28/2020 for the symptoms described in the history of present illness. He was evaluated in the context of the global COVID-19 pandemic, which necessitated consideration that the patient might be at risk for infection with the SARS-CoV-2 virus that causes COVID-19. Institutional protocols and algorithms that pertain to the evaluation of patients at risk for COVID-19 are in a state of rapid change based on information released by regulatory bodies including the CDC and federal and state organizations. These policies and algorithms were followed during the patient's care in the ED.  Some ED evaluations and interventions may be delayed as a result of limited staffing during the pandemic.*     Medical Decision Making: 34 year old male here with suspected heat exhaustion, less likely early heatstroke that has now resolved.  He has no focal neurological deficits.  He did not lose consciousness.  Differential also includes transient supraventricular tachycardia, as he does exhibit nicotine and drink 2 energy drinks, though his smart watch did not record any heart rates over 100.  Clinically, he appears well other than mild dehydration.  Screening lab work shows no anemia, electrolyte abnormalities, or other acute abnormality.  His EKG is nonischemic with no arrhythmogenic abnormalities.  Urinalysis does show cloudy urine with elevated specific gravity consistent with  dehydration.  Given that his symptoms have now resolved other than a headache, which is usual for him, feel he is reasonable to discharge with encouraged hydration at home and will provide with a work note to avoid heat for the next several days.  Regarding his headache, he was given Motrin and Reglan here.  He has a history of migraines.  This headache did not correlate with his symptoms, no thunderclap onset, and I do not suspect subarachnoid or other emergent etiology.  Suspect this is related to his dehydration.  ____________________________________________  FINAL CLINICAL IMPRESSION(S) / ED DIAGNOSES  Final diagnoses:  Dehydration  Heat exhaustion, initial encounter     MEDICATIONS GIVEN DURING THIS VISIT:  Medications  ibuprofen (ADVIL) tablet 600 mg (600 mg Oral Given 05/28/20 1711)  metoCLOPramide (REGLAN) tablet 10 mg (10 mg Oral Given 05/28/20 1711)     ED Discharge Orders    None       Note:  This document was prepared using Dragon voice recognition software and may include unintentional dictation errors.   Shaune Pollack, MD 05/28/20 1719

## 2020-05-28 NOTE — Discharge Instructions (Signed)
As we discussed, I suspect your symptoms are due to mild heat exhaustion versus early heatstroke.  Thankfully, the treatment for this is generally removing yourself from the hot environment and hydrating, which you have done.  It is critically important that you hydrate over the next 24 hours.  I would recommend drinking at least 48 ounces of Gatorade, Powerade, or other electrolyte drink, as well as water.  If you drink the body armor, make sure you drink water between them.  If this happens again, it is critical that you immediately remove yourself from the heat, sit in a cool area, and ideally, lay down and raise your legs above your head.  This will help with blood flow.  If you are in the air conditioning, you can also sprinkle a small amount of water in your skin to help with evaporative cooling.  Do not go to work tomorrow.  Avoid being in the heat.  Please return with any concerns

## 2021-06-07 ENCOUNTER — Emergency Department: Payer: Self-pay

## 2021-06-07 ENCOUNTER — Other Ambulatory Visit: Payer: Self-pay

## 2021-06-07 ENCOUNTER — Emergency Department
Admission: EM | Admit: 2021-06-07 | Discharge: 2021-06-07 | Disposition: A | Discharge status: Home / Self Care | Payer: Self-pay | Attending: Student in an Organized Health Care Education/Training Program | Admitting: Student in an Organized Health Care Education/Training Program

## 2021-06-07 DIAGNOSIS — E860 Dehydration: Secondary | ICD-10-CM

## 2021-06-07 DIAGNOSIS — J0100 Acute maxillary sinusitis, unspecified: Secondary | ICD-10-CM

## 2021-06-07 DIAGNOSIS — R42 Dizziness and giddiness: Secondary | ICD-10-CM

## 2021-06-07 DIAGNOSIS — Z20822 Contact with and (suspected) exposure to covid-19: Secondary | ICD-10-CM | POA: Insufficient documentation

## 2021-06-07 DIAGNOSIS — F17210 Nicotine dependence, cigarettes, uncomplicated: Secondary | ICD-10-CM | POA: Insufficient documentation

## 2021-06-07 LAB — URINALYSIS, COMPLETE (UACMP) WITH MICROSCOPIC
Bacteria, UA: NONE SEEN
Bilirubin Urine: NEGATIVE
Glucose, UA: NEGATIVE mg/dL
Hgb urine dipstick: NEGATIVE
Ketones, ur: NEGATIVE mg/dL
Leukocytes,Ua: NEGATIVE
Nitrite: NEGATIVE
Protein, ur: NEGATIVE mg/dL
Specific Gravity, Urine: 1.01 (ref 1.005–1.030)
Squamous Epithelial / LPF: NONE SEEN (ref 0–5)
pH: 6 (ref 5.0–8.0)

## 2021-06-07 LAB — CBC
HCT: 46.6 % (ref 39.0–52.0)
Hemoglobin: 16.5 g/dL (ref 13.0–17.0)
MCH: 32.4 pg (ref 26.0–34.0)
MCHC: 35.4 g/dL (ref 30.0–36.0)
MCV: 91.6 fL (ref 80.0–100.0)
Platelets: 197 10*3/uL (ref 150–400)
RBC: 5.09 MIL/uL (ref 4.22–5.81)
RDW: 11.7 % (ref 11.5–15.5)
WBC: 6.7 10*3/uL (ref 4.0–10.5)
nRBC: 0 % (ref 0.0–0.2)

## 2021-06-07 LAB — RESP PANEL BY RT-PCR (FLU A&B, COVID) ARPGX2
Influenza A by PCR: NEGATIVE
Influenza B by PCR: NEGATIVE
SARS Coronavirus 2 by RT PCR: NEGATIVE

## 2021-06-07 LAB — HEPATIC FUNCTION PANEL
ALT: 19 U/L (ref 0–44)
AST: 24 U/L (ref 15–41)
Albumin: 4.3 g/dL (ref 3.5–5.0)
Alkaline Phosphatase: 75 U/L (ref 38–126)
Bilirubin, Direct: <0.1 mg/dL (ref 0.0–0.2)
Total Bilirubin: 0.3 mg/dL (ref 0.3–1.2)
Total Protein: 6.8 g/dL (ref 6.5–8.1)

## 2021-06-07 LAB — BASIC METABOLIC PANEL
Anion gap: 9 (ref 5–15)
BUN: 12 mg/dL (ref 6–20)
CO2: 23 mmol/L (ref 22–32)
Calcium: 9.1 mg/dL (ref 8.9–10.3)
Chloride: 106 mmol/L (ref 98–111)
Creatinine, Ser: 0.81 mg/dL (ref 0.61–1.24)
GFR, Estimated: >60 mL/min (ref >60)
Glucose, Bld: 129 mg/dL — ABNORMAL HIGH (ref 70–99)
Potassium: 3.8 mmol/L (ref 3.5–5.1)
Sodium: 138 mmol/L (ref 135–145)

## 2021-06-07 LAB — TROPONIN I (HIGH SENSITIVITY): Troponin I (High Sensitivity): 6 ng/L (ref <18)

## 2021-06-07 MED ORDER — MECLIZINE HCL 25 MG PO TABS
25.0000 mg | ORAL_TABLET | Freq: Once | ORAL | Status: AC
  Administered 2021-06-07: 25 mg via ORAL
  Filled 2021-06-07: qty 1

## 2021-06-07 MED ORDER — SODIUM CHLORIDE 0.9 % IV BOLUS
1000.0000 mL | Freq: Once | INTRAVENOUS | Status: AC
Start: 2021-06-07 — End: 2021-06-07
  Administered 2021-06-07: 1000 mL via INTRAVENOUS

## 2021-06-07 MED ORDER — MECLIZINE HCL 25 MG PO TABS: 25.0000 mg | ORAL_TABLET | Freq: Three times a day (TID) | ORAL | 0 refills | Status: DC | PRN

## 2021-06-07 MED ORDER — AMOXICILLIN 875 MG PO TABS: 875.0000 mg | ORAL_TABLET | Freq: Two times a day (BID) | ORAL | 0 refills | Status: DC

## 2021-06-07 MED ORDER — SODIUM CHLORIDE 0.9 % IV BOLUS
1000.0000 mL | Freq: Once | INTRAVENOUS | Status: AC
  Administered 2021-06-07: 1000 mL via INTRAVENOUS

## 2021-06-07 MED ORDER — PREDNISONE 10 MG PO TABS: 30.0000 mg | ORAL_TABLET | Freq: Every day | ORAL | 0 refills | Status: DC

## 2021-06-07 NOTE — ED Triage Notes (Signed)
Pt come with c/o dizziness and lightheadedness. Tp states this started this am when he woke up. Pt states he feels like he could pass out at any given time.

## 2021-06-07 NOTE — ED Notes (Signed)
See triage note  Presents with h/a and dizziness   States he woke up with the dizziness this am   Also h/a  Denies blurred vision but states "tunnel vision"  No fever or cough

## 2021-06-07 NOTE — ED Provider Notes (Signed)
[facility] Emergency Department Provider Note  ____________________________________________   Event Date/Time   First MD Initiated Contact with Patient 06/07/21 [phone]     (approximate)  I have reviewed the triage vital signs and the nursing notes.   HISTORY  Chief Complaint Dizziness    HPI Jerry Henson is a 35 y.o. male presents emergency department with weakness and dizziness.  Patient states that he was dizzy when he woke up this morning.  Got ready to go to work and went back to the bedroom where he just kind of collapsed onto the bed.  No fever or chills.  No cough or congestion.  No vomiting or diarrhea.  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  No recent EtOH use.  Patient is not vaccinated for COVID.  He does not know of any COVID exposures.  Past Medical History:  Diagnosis Date   Migraines     Patient Active Problem List   Diagnosis Date Noted   Epididymitis 07/16/2018    Past Surgical History:  Procedure Laterality Date   VASECTOMY      Prior to Admission medications   Medication Sig Start Date End Date Taking? Authorizing Provider  meclizine (ANTIVERT) 25 MG tablet Take 1 tablet (25 mg total) by mouth 3 (three) times daily as needed for dizziness. 06/07/21  Yes Chloeanne Poteet, Roselyn Bering, PA-C  predniSONE (DELTASONE) 10 MG tablet Take 3 tablets (30 mg total) by mouth daily with breakfast. 06/07/21  Yes Jamera Vanloan, Roselyn Bering, PA-C  amoxicillin (AMOXIL) 875 MG tablet Take 1 tablet (875 mg total) by mouth 2 (two) times daily. 06/07/21   Faythe Ghee, PA-C    Allergies Pepto-bismol [bismuth subsalicylate]  Family History  Problem Relation Age of Onset   Prostate cancer Neg Hx    Bladder Cancer Neg Hx    Kidney cancer Neg Hx     Social History Social History   Tobacco Use   Smoking status: Some Days    Packs/day: 1.00    Types: Cigarettes   Smokeless tobacco: Current    Types: Chew  Vaping Use   Vaping Use: Never used   Substance Use Topics   Alcohol use: Yes    Alcohol/week: 2.0 standard drinks    Types: 2 Cans of beer per week   Drug use: Never    Review of Systems  Constitutional: No fever/chills Eyes: No visual changes. ENT: No sore throat. Respiratory: Denies cough Cardiovascular: Denies chest pain Gastrointestinal: Denies abdominal pain Genitourinary: Negative for dysuria. Musculoskeletal: Negative for back pain. Skin: Negative for rash. Psychiatric: no mood changes,     ____________________________________________   PHYSICAL EXAM:  VITAL SIGNS: ED Triage Vitals  Enc Vitals Group     BP 06/07/21 0812 131/86     Pulse Rate 06/07/21 0812 64     Resp 06/07/21 0812 18     Temp 06/07/21 0812 98.6 F (37 C)     Temp Source 06/07/21 0812 Oral     SpO2 06/07/21 0812 98 %     Weight 06/07/21 0913 179 lb 14.3 oz (81.6 kg)     Height 06/07/21 0913 5\' 9"  (1.753 m)     Head Circumference --      Peak Flow --      Pain Score 06/07/21 0810 8     Pain Loc --      Pain Edu? --      Excl. in GC? --     Constitutional: Alert  and oriented. Well appearing and in no acute distress. Eyes: Conjunctivae are normal.  6 beats nystagmus bilaterally, reproduces dizziness Head: Atraumatic. Nose: No congestion/rhinnorhea. Mouth/Throat: Mucous membranes are moist.   Neck:  supple no lymphadenopathy noted Cardiovascular: Normal rate, regular rhythm. Heart sounds are normal Respiratory: Normal respiratory effort.  No retractions, lungs c t a  Abd: soft nontender bs normal all 4 quad GU: deferred Musculoskeletal: FROM all extremities, warm and well perfused Neurologic:  Normal speech and language.  Radial nerves II through XII grossly intact Skin:  Skin is warm, dry and intact. No rash noted. Psychiatric: Mood and affect are normal. Speech and behavior are normal.  ____________________________________________   LABS (all labs ordered are listed, but only abnormal results are displayed)  Labs  Reviewed  BASIC METABOLIC PANEL - Abnormal; Notable for the following components:      Result Value   Glucose, Bld 129 (*)    All other components within normal limits  URINALYSIS, COMPLETE (UACMP) WITH MICROSCOPIC - Abnormal; Notable for the following components:   Color, Urine YELLOW (*)    APPearance CLEAR (*)    All other components within normal limits  RESP PANEL BY RT-PCR (FLU A&B, COVID) ARPGX2  CBC  HEPATIC FUNCTION PANEL  CBG MONITORING, ED  TROPONIN I (HIGH SENSITIVITY)   ____________________________________________   ____________________________________________  RADIOLOGY    ____________________________________________   PROCEDURES  Procedure(s) performed: No  Procedures    ____________________________________________   INITIAL IMPRESSION / ASSESSMENT AND PLAN / ED COURSE  Pertinent labs & imaging results that were available during my care of the patient were reviewed by me and considered in my medical decision making (see chart for details).   The patient is a 35 year old male presents with weakness and dizziness.  See HPI.  Physical exam shows patient appears stable  DDx: Dehydration, MI, covid, chronic fatigue, anemia, diabetes  CBC is normal, basic metabolic panel has elevated glucose of 129, hepatic functions normal, troponin is normal, urinalysis is normal, respiratory panel is negative  EKG shows normal sinus rhythm, see physician read  CT of the head without contrast reviewed by me confirmed by radiology to be negative for any acute abnormality: shows sinus disease.  Patient is feeling better after fluids and meclizine.  He will be discharged with an antibiotic for sinusitis, prednisone 30 mg daily for 3 days, drink plenty of fluids.  Return if worsening.  See his regular doctor if not improving in 3 days.  Is discharged stable condition  Jerry Henson was evaluated in Emergency Department on 06/07/2021 for the symptoms described in the  history of present illness. He was evaluated in the context of the global COVID-19 pandemic, which necessitated consideration that the patient might be at risk for infection with the SARS-CoV-2 virus that causes COVID-19. Institutional protocols and algorithms that pertain to the evaluation of patients at risk for COVID-19 are in a state of rapid change based on information released by regulatory bodies including the CDC and federal and state organizations. These policies and algorithms were followed during the patient's care in the ED.    As part of my medical decision making, I reviewed the following data within the electronic MEDICAL RECORD NUMBER History obtained from family, Nursing notes reviewed and incorporated, Labs reviewed , EKG interpreted NSR, Old chart reviewed, Radiograph reviewed , Notes from prior ED visits, and [place] Controlled Substance Database  ____________________________________________   FINAL CLINICAL IMPRESSION(S) / ED DIAGNOSES  Final diagnoses:  Dizziness  Dehydration  Subacute maxillary sinusitis      NEW MEDICATIONS STARTED DURING THIS VISIT:  New Prescriptions   AMOXICILLIN (AMOXIL) 875 MG TABLET    Take 1 tablet (875 mg total) by mouth 2 (two) times daily.   MECLIZINE (ANTIVERT) 25 MG TABLET    Take 1 tablet (25 mg total) by mouth 3 (three) times daily as needed for dizziness.   PREDNISONE (DELTASONE) 10 MG TABLET    Take 3 tablets (30 mg total) by mouth daily with breakfast.     Note:  This document was prepared using Dragon voice recognition software and may include unintentional dictation errors.    Faythe Ghee, PA-C 06/07/21 1303    Willy Eddy, MD 06/07/21 [phone]

## 2021-06-09 ENCOUNTER — Emergency Department
Admission: EM | Admit: 2021-06-09 | Discharge: 2021-06-09 | Disposition: A | Discharge status: Home / Self Care | Payer: No Typology Code available for payment source | Attending: Student in an Organized Health Care Education/Training Program | Admitting: Student in an Organized Health Care Education/Training Program

## 2021-06-09 ENCOUNTER — Encounter: Payer: Self-pay | Admitting: Emergency Medicine

## 2021-06-09 ENCOUNTER — Other Ambulatory Visit: Payer: Self-pay

## 2021-06-09 DIAGNOSIS — E876 Hypokalemia: Secondary | ICD-10-CM | POA: Insufficient documentation

## 2021-06-09 DIAGNOSIS — F17210 Nicotine dependence, cigarettes, uncomplicated: Secondary | ICD-10-CM | POA: Insufficient documentation

## 2021-06-09 DIAGNOSIS — J0110 Acute frontal sinusitis, unspecified: Secondary | ICD-10-CM | POA: Insufficient documentation

## 2021-06-09 DIAGNOSIS — R42 Dizziness and giddiness: Secondary | ICD-10-CM | POA: Insufficient documentation

## 2021-06-09 LAB — CBC WITH DIFFERENTIAL/PLATELET
Abs Immature Granulocytes: 0.02 10*3/uL (ref 0.00–0.07)
Basophils Absolute: 0.1 10*3/uL (ref 0.0–0.1)
Basophils Relative: 1 %
Eosinophils Absolute: 0.2 10*3/uL (ref 0.0–0.5)
Eosinophils Relative: 2 %
HCT: 47.1 % (ref 39.0–52.0)
Hemoglobin: 16.8 g/dL (ref 13.0–17.0)
Immature Granulocytes: 0 %
Lymphocytes Relative: 32 %
Lymphs Abs: 3.1 10*3/uL (ref 0.7–4.0)
MCH: 32.4 pg (ref 26.0–34.0)
MCHC: 35.7 g/dL (ref 30.0–36.0)
MCV: 90.9 fL (ref 80.0–100.0)
Monocytes Absolute: 0.4 10*3/uL (ref 0.1–1.0)
Monocytes Relative: 4 %
Neutro Abs: 5.8 10*3/uL (ref 1.7–7.7)
Neutrophils Relative %: 61 %
Platelets: 217 10*3/uL (ref 150–400)
RBC: 5.18 MIL/uL (ref 4.22–5.81)
RDW: 11.9 % (ref 11.5–15.5)
WBC: 9.6 10*3/uL (ref 4.0–10.5)
nRBC: 0 % (ref 0.0–0.2)

## 2021-06-09 LAB — BASIC METABOLIC PANEL
Anion gap: 13 (ref 5–15)
BUN: 12 mg/dL (ref 6–20)
CO2: 24 mmol/L (ref 22–32)
Calcium: 9.7 mg/dL (ref 8.9–10.3)
Chloride: 103 mmol/L (ref 98–111)
Creatinine, Ser: 0.87 mg/dL (ref 0.61–1.24)
GFR, Estimated: >60 mL/min (ref >60)
Glucose, Bld: 136 mg/dL — ABNORMAL HIGH (ref 70–99)
Potassium: 3.2 mmol/L — ABNORMAL LOW (ref 3.5–5.1)
Sodium: 140 mmol/L (ref 135–145)

## 2021-06-09 MED ORDER — FLUTICASONE PROPIONATE 50 MCG/ACT NA SUSP: 1.0000 | Freq: Every day | NASAL | 0 refills | Status: DC

## 2021-06-09 MED ORDER — DOXYCYCLINE HYCLATE 100 MG PO TABS
100.0000 mg | ORAL_TABLET | Freq: Once | ORAL | Status: AC
  Administered 2021-06-09: 100 mg via ORAL
  Filled 2021-06-09: qty 1

## 2021-06-09 MED ORDER — DIPHENHYDRAMINE HCL 25 MG PO CAPS
50.0000 mg | ORAL_CAPSULE | Freq: Once | ORAL | Status: AC
  Administered 2021-06-09: 50 mg via ORAL
  Filled 2021-06-09: qty 2

## 2021-06-09 MED ORDER — POTASSIUM CHLORIDE ER 10 MEQ PO TBCR: 20.0000 meq | EXTENDED_RELEASE_TABLET | Freq: Every day | ORAL | 0 refills | Status: DC

## 2021-06-09 MED ORDER — DOXYCYCLINE MONOHYDRATE 100 MG PO TABS: 100.0000 mg | ORAL_TABLET | Freq: Two times a day (BID) | ORAL | 0 refills | Status: AC

## 2021-06-09 MED ORDER — POTASSIUM CHLORIDE CRYS ER 20 MEQ PO TBCR
40.0000 meq | EXTENDED_RELEASE_TABLET | Freq: Once | ORAL | Status: AC
  Administered 2021-06-09: 40 meq via ORAL
  Filled 2021-06-09: qty 2

## 2021-06-09 NOTE — Discharge Instructions (Addendum)
Please continue Amoxicillin as previously prescribed. Add doxycycline as prescribed today. You may try Benadryl, 50mg  orally every 6 hours for symptoms. You may discontinue the prednisone. You can try Flonase, 2 sprays each nostril to help with sinus congestion and pressure. You were also prescribed 3 days of potassium supplement. DO NOT drive while taking the benadryl or while still symptomatic. Follow up with primary care or return to the ER for any worsening.

## 2021-06-09 NOTE — ED Triage Notes (Signed)
Pt reports that he was seen her on 8/22 for the same symptoms, reports not getting any better. Was diagnosed  with vertigo and sinusitis. They prescribed him prednisone, Amoxicillin, and Meclizine. He did not take them this am because he states that he was planning on coming back.

## 2021-06-09 NOTE — ED Provider Notes (Signed)
[facility] Provider Note  ____________________________________________   Event Date/Time   First MD Initiated Contact with Patient 06/09/21 1107     (approximate)  I have reviewed the triage vital signs and the nursing notes.   HISTORY  Chief Complaint Facial Pain   HPI [name] is a 35 y.o. male who reports to the ER for evaluation of sinus pressure, dizziness. He reports he was seen here 2 days ago, diagnosed with vertigo and sinusitis. Took dose of meds Monday PM, all day yesterday, no meds today. Reports symptoms feel worse, particularly in terms of feeling "drunk" and having a hard time sleeping. He denies any fevers, cough, rhinorrhea, chest pain, abdominal pain, nausea, vomiting, diarrhea or weakness. He denies any new head trauma. He reports hx of similar symptoms usually accompanied by Migraines, but denies headache with this episode.        Past Medical History:  Diagnosis Date   Migraines     Patient Active Problem List   Diagnosis Date Noted   Epididymitis 07/16/2018    Past Surgical History:  Procedure Laterality Date   VASECTOMY      Prior to Admission medications   Medication Sig Start Date End Date Taking? Authorizing Provider  doxycycline (ADOXA) 100 MG tablet Take 1 tablet (100 mg total) by mouth 2 (two) times daily for 10 days. 06/09/21 06/19/21 Yes Hobart Marte, Ruben Gottron, PA  fluticasone (FLONASE) 50 MCG/ACT nasal spray Place 1 spray into both nostrils daily. 06/09/21 06/09/22 Yes Takota Cahalan, Ruben Gottron, PA  potassium chloride (KLOR-CON 10) 10 MEQ tablet Take 2 tablets (20 mEq total) by mouth daily for 3 days. 06/09/21 06/12/21 Yes Nyair Depaulo, Ruben Gottron, PA  amoxicillin (AMOXIL) 875 MG tablet Take 1 tablet (875 mg total) by mouth 2 (two) times daily. 06/07/21   Sherrie Mustache Roselyn Bering, PA-C  meclizine (ANTIVERT) 25 MG tablet Take 1 tablet (25 mg total) by mouth 3 (three) times daily as needed for dizziness. 06/07/21   Sherrie Mustache  Roselyn Bering, PA-C  predniSONE (DELTASONE) 10 MG tablet Take 3 tablets (30 mg total) by mouth daily with breakfast. 06/07/21   Sherrie Mustache Roselyn Bering, PA-C    Allergies Pepto-bismol [bismuth subsalicylate]  Family History  Problem Relation Age of Onset   Prostate cancer Neg Hx    Bladder Cancer Neg Hx    Kidney cancer Neg Hx     Social History Social History   Tobacco Use   Smoking status: Some Days    Packs/day: 1.00    Types: Cigarettes   Smokeless tobacco: Current    Types: Chew  Vaping Use   Vaping Use: Never used  Substance Use Topics   Alcohol use: Yes    Alcohol/week: 2.0 standard drinks    Types: 2 Cans of beer per week   Drug use: Never    Review of Systems Constitutional: No fever/chills Eyes: No visual changes. ENT: +facial pain, No sore throat. Cardiovascular: Denies chest pain. Respiratory: Denies shortness of breath. Gastrointestinal: No abdominal pain.  No nausea, no vomiting.  No diarrhea.  No constipation. Genitourinary: Negative for dysuria. Musculoskeletal: Negative for back pain. Skin: Negative for rash. Neurological: +dizziness, Negative for headaches, focal weakness or numbness.   ____________________________________________   PHYSICAL EXAM:  VITAL SIGNS: ED Triage Vitals  Enc Vitals Group     BP 06/09/21 0943 124/85     Pulse Rate 06/09/21 0943 66     Resp 06/09/21 0943 20     Temp 06/09/21 0943  98.7 F (37.1 C)     Temp Source 06/09/21 0943 Oral     SpO2 06/09/21 0943 99 %     Weight 06/09/21 0945 175 lb (79.4 kg)     Height 06/09/21 0945 5\' 9"  (1.753 m)     Head Circumference --      Peak Flow --      Pain Score 06/09/21 0945 8     Pain Loc --      Pain Edu? --      Excl. in GC? --    Constitutional: Alert and oriented. Well appearing and in no acute distress. Eyes: Conjunctivae are normal. PERRL. EOMI with horizontal nystagmus noted, worse to left - reproduces symptoms. No vertical or rotational nystagmus noted Head: Atraumatic.  Tenderness with percussion over the frontal and maxillary sinuses Nose: No congestion/rhinnorhea. Mouth/Throat: Mucous membranes are moist.  Oropharynx non-erythematous. Ears: Left ear with effusion and mild bulging, no pustular appearance. Right TM appears normal. Neck: No stridor.   Cardiovascular: Normal rate, regular rhythm. Grossly normal heart sounds.  Good peripheral circulation. Respiratory: Normal respiratory effort.  No retractions. Lungs CTAB. Gastrointestinal: Soft and nontender. No distention. No abdominal bruits. No CVA tenderness. Musculoskeletal: No lower extremity tenderness nor edema.  No joint effusions. Neurologic:  Normal speech and language. CN II-XII grossly intact. No pronator drift, no dysarthria. No gross focal neurologic deficits are appreciated. No gait instability. Skin:  Skin is warm, dry and intact. No rash noted. Psychiatric: Mood and affect are normal. Speech and behavior are normal.  ____________________________________________   LABS (all labs ordered are listed, but only abnormal results are displayed)  Labs Reviewed  BASIC METABOLIC PANEL - Abnormal; Notable for the following components:      Result Value   Potassium 3.2 (*)    Glucose, Bld 136 (*)    All other components within normal limits  CBC WITH DIFFERENTIAL/PLATELET    ____________________________________________   INITIAL IMPRESSION / ASSESSMENT AND PLAN / ED COURSE  As part of my medical decision making, I reviewed the following data within the electronic MEDICAL RECORD NUMBER Nursing notes reviewed and incorporated, Labs reviewed, and Notes from prior ED visits        Patient is a 35 yo male who presents to the ER for evaluation of persistent facial pain and vertigo symptoms with mild worsening since being seen on Monday. See HPI.  Old records reviewed. At that time, grossly normal labs, normal EKG, CT w/o contrast of head showed mild sinus disease with no other findings. Started on  Amoxicillin, meclizine and prednisone.  In triage, patient has normal vital signs. Labs with mild hypokalemia otherwise WNL. PE as above. Notably the patient has horizontal nystagmus, worse to the left with reproduction of symptoms but no other acute neuro deficits are appreciated. Also notable effusion of the left ear, likely worsening vertigo symptoms. Patient has only had 36 hours of abx, but will add Doxycycline to cover for atypical source. He doesn't feel meclizine is helping, will try benadryl. Return precautions discussed at length, particularly for any worsening or lack of improvement. Patient amenable with plan, stable for outpatient follow up.       ____________________________________________   FINAL CLINICAL IMPRESSION(S) / ED DIAGNOSES  Final diagnoses:  Acute non-recurrent frontal sinusitis  Vertigo     ED Discharge Orders          Ordered    doxycycline (ADOXA) 100 MG tablet  2 times daily  06/09/21 1221    potassium chloride (KLOR-CON 10) 10 MEQ tablet  Daily        06/09/21 1221    fluticasone (FLONASE) 50 MCG/ACT nasal spray  Daily        06/09/21 1221             Note:  This document was prepared using Dragon voice recognition software and may include unintentional dictation errors.    Lucy Chris, PA 06/09/21 1221    Willy Eddy, MD 06/09/21 1346

## 2021-06-15 ENCOUNTER — Telehealth: Payer: Self-pay

## 2021-06-15 NOTE — Telephone Encounter (Signed)
Copied from CRM [id number]. Topic: Appointment Scheduling - Scheduling Inquiry for Clinic >> Jun 15, 2021  8:20 AM Traci Sermon wrote: Reason for CRM: Pt called in stating he was wanting to get a new patient appt, pt states he has been to many hospitals and really needs a PCP. Pt requested if he could be squeezed in sometime this week if possible. Please advise

## 2021-06-15 NOTE — Telephone Encounter (Signed)
Patient put on wait list for appointment.

## 2022-01-12 IMAGING — CT CT HEAD W/O CM
3 series · 15 of 47 positions shown, 18 images · non-contrast
Comparison: None

CLINICAL DATA: Dizziness, nonspecific, new onset of headache.

EXAM:
CT HEAD WITHOUT CONTRAST
TECHNIQUE: Contiguous axial images were obtained from the base of the skull
through the vertex without intravenous contrast.

[Series 2: head wo · axial · 0.46mm/px · z∈[-104,+41]mm · 9 of 35 slices shown, 12 images]
[im 3/35  brain]
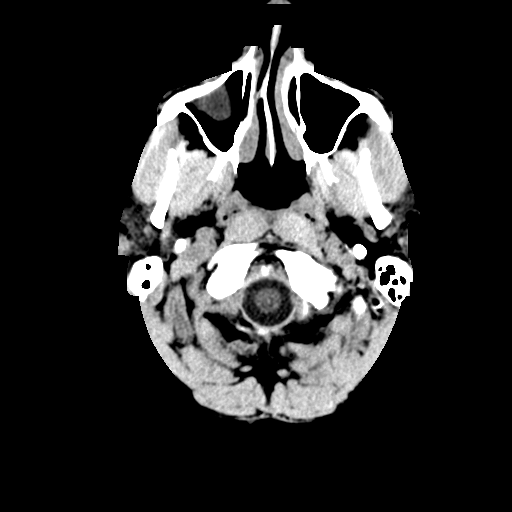
[im 3/35  bone]
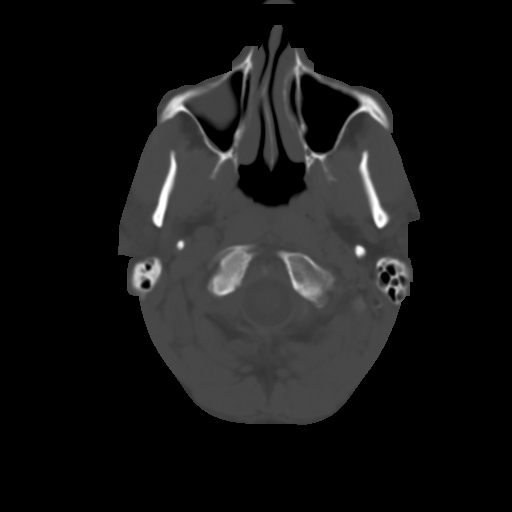
[im 6/35  brain]
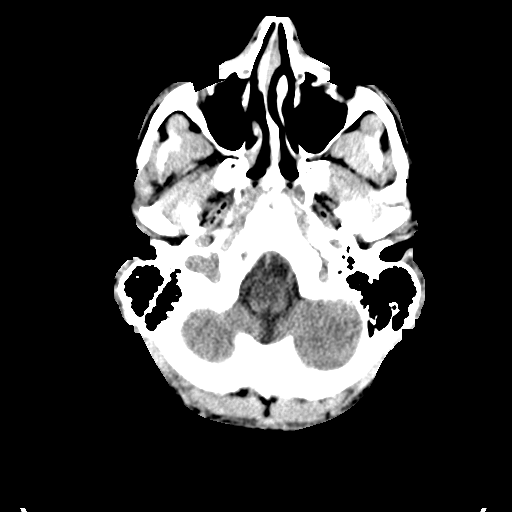
[im 10/35  brain]
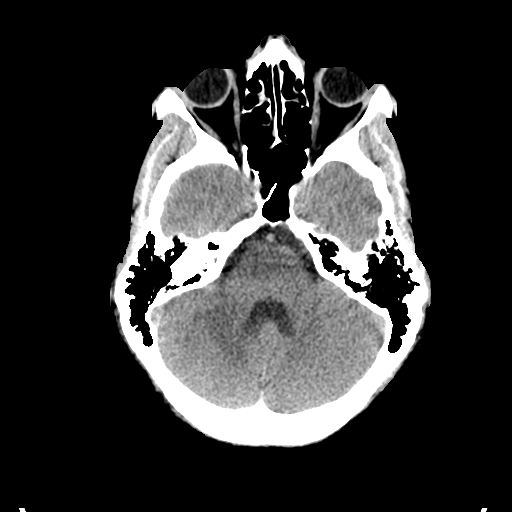
[im 13/35  brain]
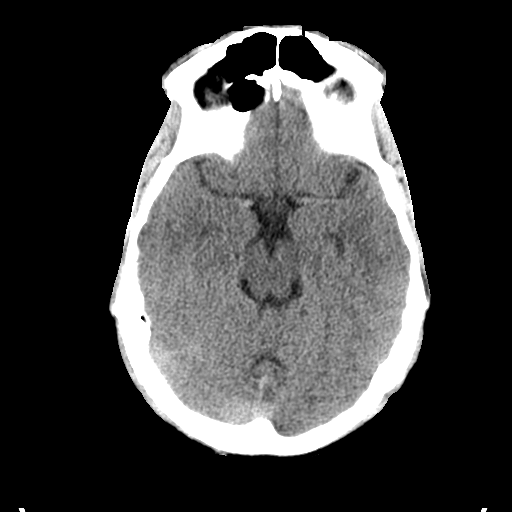
[im 18/35  brain]
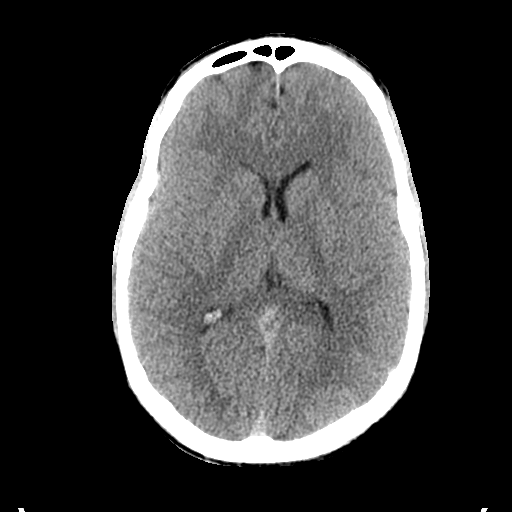
[im 18/35  bone]
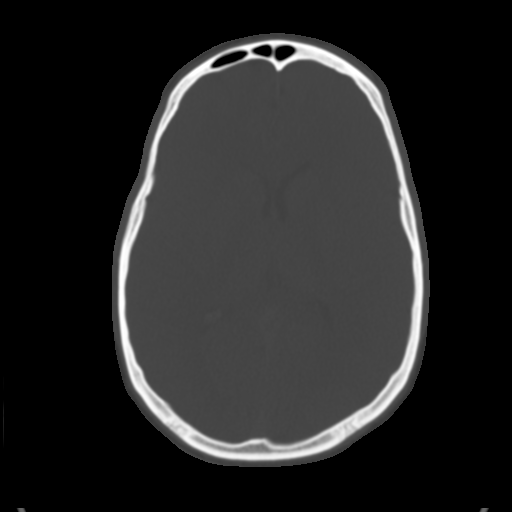
[im 22/35  brain]
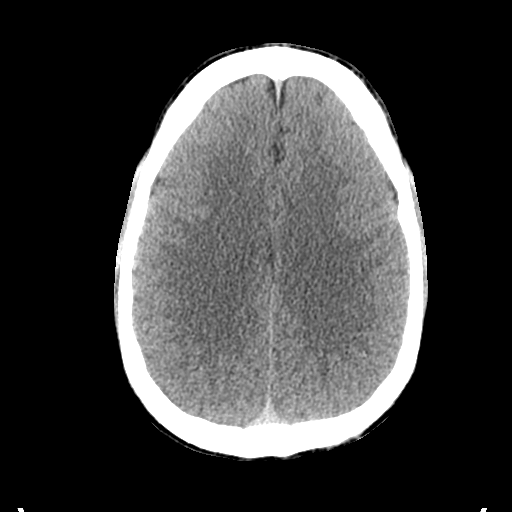
[im 25/35  brain]
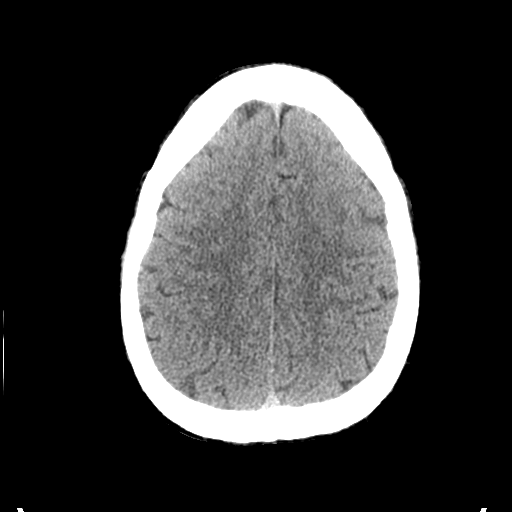
[im 29/35  brain]
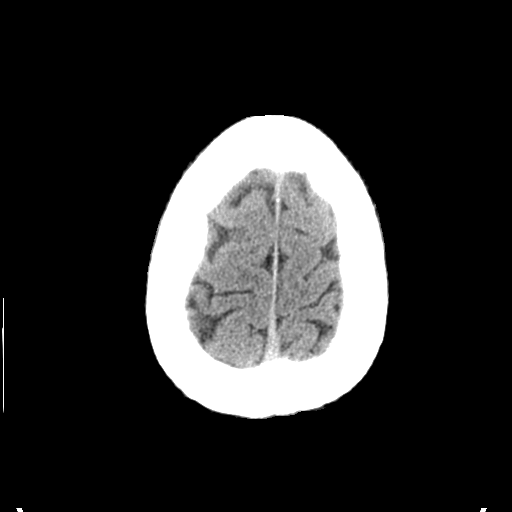
[im 32/35  brain]
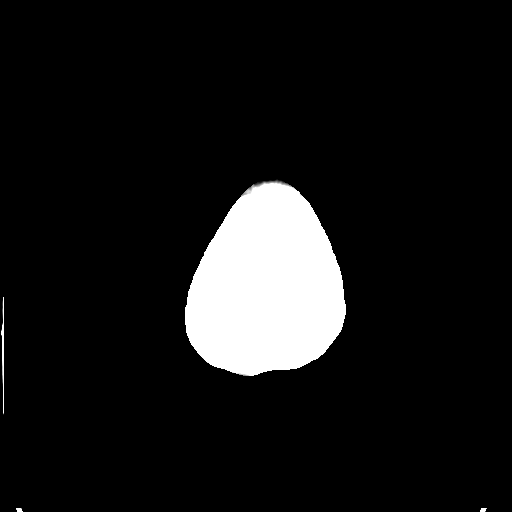
[im 32/35  bone]
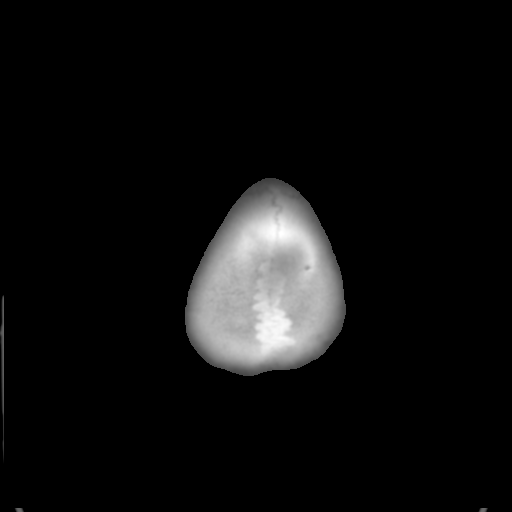

[Series 4: coronal soft tissue · coronal · 0.36mm/px · 3 of 73 slices shown]
[im 25/73  brain]
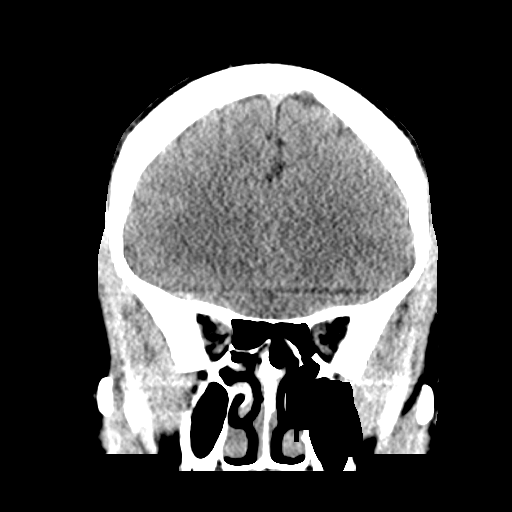
[im 33/73  brain]
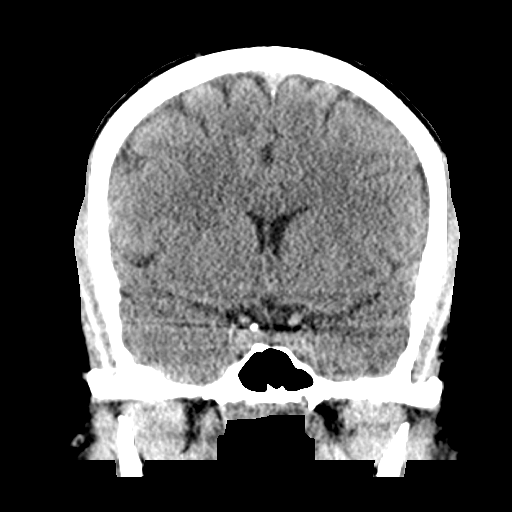
[im 41/73  brain]
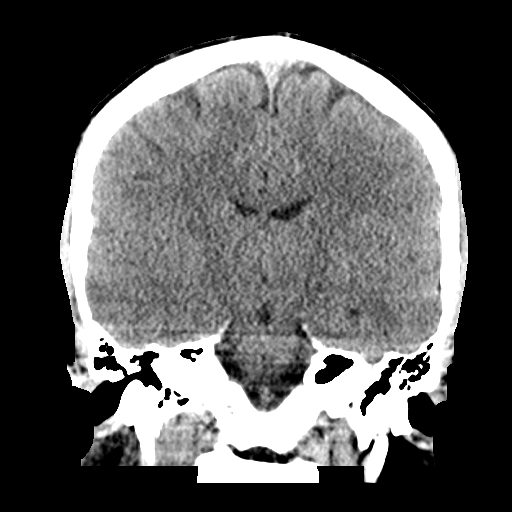

[Series 5: sagittal soft tissue · sagittal · 0.39mm/px · 3 of 84 slices shown]
[im 28/84  brain]
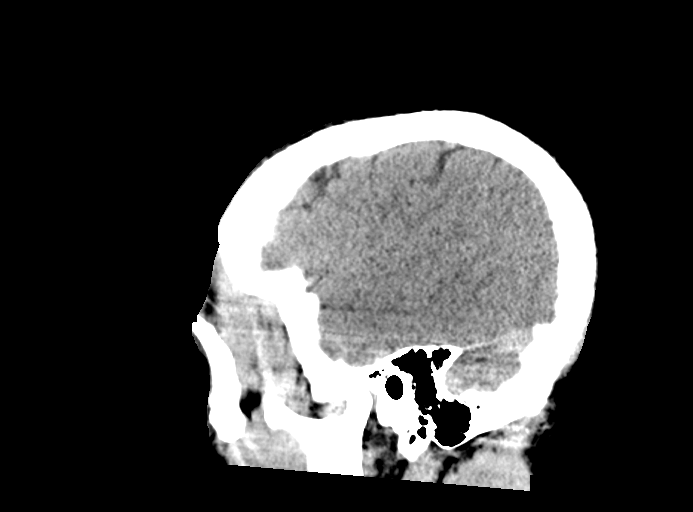
[im 42/84  brain]
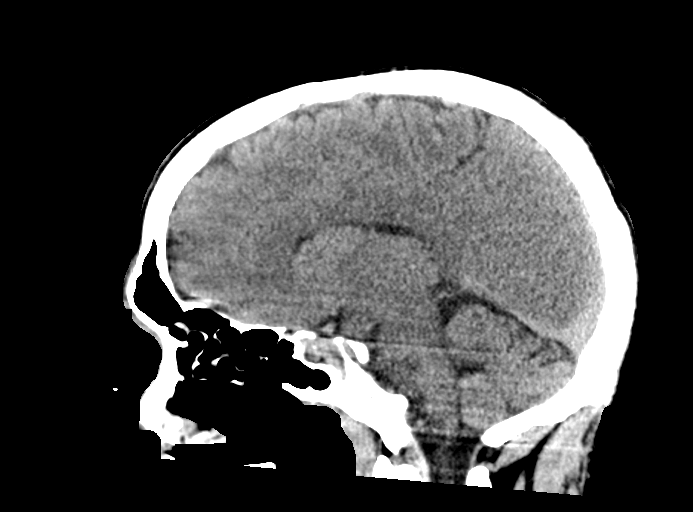
[im 56/84  brain]
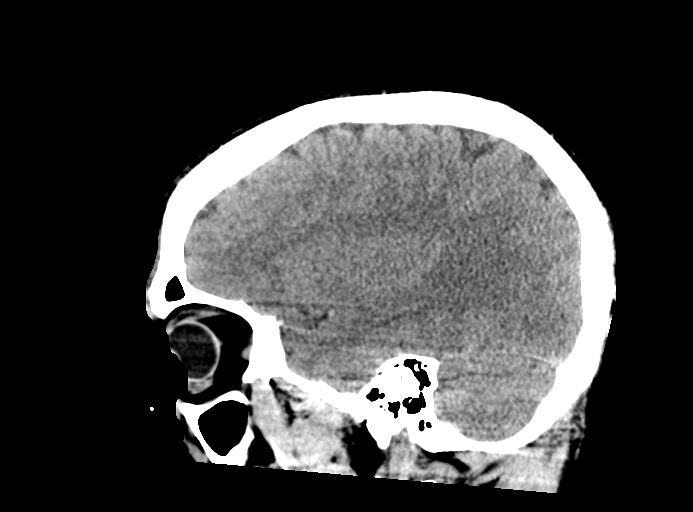

[15 of 47 positions shown; findings below may reference images not displayed]

FINDINGS: Brain: No evidence of acute infarction, hemorrhage, hydrocephalus,
extra-axial collection or mass lesion/mass effect.

Vascular: No hyperdense vessel or unexpected calcification.

Skull: Calvarium is intact. No focal lytic or blastic lesions are
present. No significant extracranial soft tissue lesion is present.

Sinuses/Orbits: Polyps or mucous retention cysts are present in the
inferior maxillary sinuses bilaterally. Mild mucosal thickening is
present in the maxillary sinuses otherwise. Mild mucosal thickening
is scattered throughout the ethmoid air cells and inferior frontal
sinuses, left greater than right. No fluid levels are present.
Mastoid air cells are clear bilaterally. The globes and orbits are
within normal limits.
IMPRESSION: 1. Normal CT appearance of the brain.
2. Mild sinus disease.

## 2022-09-21 ENCOUNTER — Telehealth: Payer: Self-pay

## 2022-09-21 ENCOUNTER — Ambulatory Visit (INDEPENDENT_AMBULATORY_CARE_PROVIDER_SITE_OTHER): Payer: Self-pay | Admitting: Urology

## 2022-09-21 ENCOUNTER — Encounter: Payer: Self-pay | Admitting: Urology

## 2022-09-21 VITALS — BP 119/75 | HR 58 | Ht 69.0 in | Wt 160.0 lb

## 2022-09-21 DIAGNOSIS — N451 Epididymitis: Secondary | ICD-10-CM

## 2022-09-21 DIAGNOSIS — N50819 Testicular pain, unspecified: Secondary | ICD-10-CM

## 2022-09-21 DIAGNOSIS — N5082 Scrotal pain: Secondary | ICD-10-CM

## 2022-09-21 DIAGNOSIS — N50812 Left testicular pain: Secondary | ICD-10-CM

## 2022-09-21 MED ORDER — CELECOXIB 200 MG PO CAPS: 200.0000 mg | ORAL_CAPSULE | Freq: Two times a day (BID) | ORAL | 0 refills | Status: AC

## 2022-09-21 MED ORDER — SULFAMETHOXAZOLE-TRIMETHOPRIM 800-160 MG PO TABS: 1.0000 | ORAL_TABLET | Freq: Two times a day (BID) | ORAL | 0 refills | Status: DC

## 2022-09-21 NOTE — Progress Notes (Signed)
   09/21/22 1:23 PM   Ashraf J Dupras [birth year] [id number]  CC: Left scrotal pain  HPI: 36 year old male added onto clinic today for acute onset of severe left scrotal pain this morning.  He has a history of vasectomy with Dr. Marlou Porch in October 2018, and reports has had some intermittent scrotal pain on either side since that time.  This can occur once every 1 to 2 months and resolve spontaneously.  He has been seen in the ER for this previously in 2019 and scrotal ultrasound was benign.  He denies any urinary symptoms or fever today.  Denies any new sexual partners.  Urinalysis today is benign   PMH: Past Medical History:  Diagnosis Date   Migraines     Surgical History: Past Surgical History:  Procedure Laterality Date   VASECTOMY      Family History: Family History  Problem Relation Age of Onset   Prostate cancer Neg Hx    Bladder Cancer Neg Hx    Kidney cancer Neg Hx     Social History:  reports that he has been smoking cigarettes. He has been smoking an average of 1 pack per day. He has been exposed to tobacco smoke. His smokeless tobacco use includes chew. He reports current alcohol use of about 2.0 standard drinks of alcohol per week. He reports that he does not use drugs.  Physical Exam: BP 119/75   Pulse (!) 58   Ht 5\' 9"  (1.753 m)   Wt 160 lb (72.6 kg)   BMI 23.63 kg/m    Constitutional:  Alert and oriented, No acute distress. Cardiovascular: No clubbing, cyanosis, or edema. Respiratory: Normal respiratory effort, no increased work of breathing. GI: Abdomen is soft, nontender, nondistended, no abdominal masses GU: Circumcised phallus with patent meatus, no lesions, testicles 20 cc and descended bilaterally, left testicle warm, mildly edematous left epididymis, left epididymis very tender, testicle nontender  Laboratory Data: Urinalysis benign, sent for culture and G/C  Pertinent Imaging: I have personally viewed and interpreted the scrotal  ultrasound from 2019 that is essentially benign.  Assessment & Plan:   36 year old male with chronic rare intermittent scrotal pain on both sides since his vasectomy in 2018 with Dr. 2019.  Reports severe left testicular pain that started this morning and has eased off somewhat.  Urinalysis benign, exam most consistent with epididymitis with focal tenderness over the epididymis.  We discussed pros and cons of a scrotal ultrasound for further evaluation but he defers.  -I recommended Bactrim DS twice daily x 7 days as well as Celebrex x 7 days, behavioral strategies of snug fitting underwear, and icing reviewed -Return precautions discussed extensively -Could consider amitriptyline or gabapentin in the future if recurrent episodes of scrotal pain  Marlou Porch, MD 09/21/2022  [facility] [address], [address] [place], [place] [phone]

## 2022-09-21 NOTE — Telephone Encounter (Signed)
Pt calls triage line and states that he had a vasectomy x 5 years ago. Today he is having severe swelling of the testicles, testicles are tender to the touch, he is also experiencing cold chills. Advised pt that we will see him in clinic at 1:15pm. Patient advises to seek care in the ED if symptoms worsens. Pt voiced understanding.

## 2022-09-21 NOTE — Patient Instructions (Signed)
Epididymitis  Epididymitis is inflammation or swelling of the epididymis. This is caused by an infection. The epididymis is a cord-like structure that is located along the top and back part of the testicle. It collects and stores sperm from the testicle. This condition can also cause pain and swelling of the testicle and scrotum. Symptoms usually start suddenly (acute epididymitis). Sometimes epididymitis starts gradually and lasts for a while (chronic epididymitis). Chronic epididymitis may be harder to treat. What are the causes? In men ages 20-40, this condition is usually caused by a bacterial infection or a sexually transmitted infection (STI), such as gonorrhea or chlamydia. In men 40 and older, this condition is usually caused by bacteria from a urinary blockage or from abnormalities in the urinary system. These can result from: Having a tube placed into the bladder (urinary catheter). Having an enlarged or inflamed prostate gland. Having recently had urinary tract surgery. Having a problem with a backward flow of urine (retrograde). In men who have a condition that weakens the body's defense system (immune system), such as human immunodeficiency virus (HIV), this condition can be caused by: Other bacteria, including tuberculosis and syphilis. Viruses. Fungi. Sometimes this condition occurs without infection. This may happen because of trauma or repetitive activities such as sports. What increases the risk? You are more likely to develop this condition if you have: Unprotected sex with more than one partner. Anal sex. Had recent surgery. A urinary catheter. Urinary problems. A suppressed immune system. What are the signs or symptoms? This condition usually begins suddenly with chills, fever, and pain behind the scrotum and in the testicle. Other symptoms include: Swelling of the scrotum, testicle, or both. Pain when ejaculating or urinating. Pain in the back or  abdomen. Nausea. Itching and discharge from the penis. A frequent need to pass urine. Redness, increased warmth, and tenderness of the scrotum. How is this diagnosed? Your health care provider can diagnose this condition based on your symptoms and medical history. Your health care provider will also do a physical exam to check your scrotum and testicle for swelling, pain, and redness. You may also have other tests, including: Testing of discharge from the penis. Testing your urine for infections, such as STIs. Ultrasound to check for blood flow and inflammation. Your health care provider may test you for other STIs, including HIV. How is this treated? Treatment for this condition depends on the cause. If your condition is caused by a bacterial infection, oral antibiotic medicine may be prescribed. If the bacterial infection has spread to your blood, you may need to receive IV antibiotics. For both bacterial and nonbacterial epididymitis, you may be treated with: Rest. Elevation of the scrotum. Pain medicines. Anti-inflammatory medicines. Surgery may be needed if: You have pus buildup in the scrotum (abscess). You have epididymitis that has not responded to other treatments. Follow these instructions at home: Medicines Take over-the-counter and prescription medicines only as told by your health care provider. If you were prescribed an antibiotic medicine, take it as told by your health care provider. Do not stop taking the antibiotic even if your condition improves. Sexual activity If your epididymitis was caused by an STI, avoid sexual activity until your treatment is complete. Inform your sexual partner or partners if you test positive for an STI. They may need to be treated. Do not engage in sexual activity with your partner or partners until their treatment is completed. Managing pain and swelling  If directed, raise (elevate) your scrotum and apply ice.   To do this: Put ice in a  plastic bag. Place a small towel or pillow between your legs. Rest your scrotum on the pillow or towel. Place another towel between your skin and the plastic bag. Leave the ice on for 20 minutes, 2-3 times a day. Remove the ice if your skin turns bright red. This is very important. If you cannot feel pain, heat, or cold, you have a greater risk of damage to the area. Keep your scrotum elevated and supported while resting. Ask your health care provider if you should wear a scrotal support, such as a jockstrap. Wear it as told by your health care provider. Try taking a sitz bath to help with discomfort. This is a warm water bath that is taken while you are sitting down. The water should come up to your hips and should cover your buttocks. Do this 3-4 times per day or as told by your health care provider. General instructions Drink enough fluid to keep your urine pale yellow. Return to your normal activities as told by your health care provider. Ask your health care provider what activities are safe for you. Keep all follow-up visits. This is important. Contact a health care provider if: You have a fever. Your pain medicine is not helping. Your pain is getting worse. Your symptoms do not improve within 3 days. Summary Epididymitis is inflammation or swelling of the epididymis. This is caused by an infection. This condition can also cause pain and swelling of the testicle and scrotum. Treatment for this condition depends on the cause. If your condition is caused by a bacterial infection, oral antibiotic medicine may be prescribed. Inform your sexual partner or partners if you test positive for an STI. They may need to be treated. Do not engage in sexual activity with your partner or partners until their treatment is completed. Contact a health care provider if your symptoms do not improve within 3 days. This information is not intended to replace advice given to you by your health care provider.  Make sure you discuss any questions you have with your health care provider. Document Revised: 05/12/2021 Document Reviewed: 05/12/2021 Elsevier Patient Education  2023 [employer]vier Inc.  

## 2022-09-22 LAB — URINALYSIS, COMPLETE
Bilirubin, UA: NEGATIVE
Glucose, UA: NEGATIVE
Ketones, UA: NEGATIVE
Leukocytes,UA: NEGATIVE
Nitrite, UA: NEGATIVE
Protein,UA: NEGATIVE
RBC, UA: NEGATIVE
Specific Gravity, UA: 1.025 (ref 1.005–1.030)
Urobilinogen, Ur: 0.2 mg/dL (ref 0.2–1.0)
pH, UA: 6 (ref 5.0–7.5)

## 2022-09-26 LAB — CULTURE, URINE COMPREHENSIVE

## 2022-10-04 ENCOUNTER — Emergency Department: Payer: Self-pay

## 2022-10-04 ENCOUNTER — Other Ambulatory Visit: Payer: Self-pay

## 2022-10-04 ENCOUNTER — Emergency Department
Admission: EM | Admit: 2022-10-04 | Discharge: 2022-10-04 | Disposition: A | Discharge status: Home / Self Care | Payer: Self-pay | Attending: Emergency Medicine | Admitting: Emergency Medicine

## 2022-10-04 DIAGNOSIS — S93402A Sprain of unspecified ligament of left ankle, initial encounter: Secondary | ICD-10-CM | POA: Insufficient documentation

## 2022-10-04 DIAGNOSIS — X58XXXA Exposure to other specified factors, initial encounter: Secondary | ICD-10-CM | POA: Insufficient documentation

## 2022-10-04 DIAGNOSIS — Y990 Civilian activity done for income or pay: Secondary | ICD-10-CM | POA: Insufficient documentation

## 2022-10-04 MED ORDER — NAPROXEN 500 MG PO TABS: 500.0000 mg | ORAL_TABLET | Freq: Two times a day (BID) | ORAL | 2 refills | Status: AC

## 2022-10-04 NOTE — ED Provider Notes (Signed)
   [facility] Provider Note    Event Date/Time   First MD Initiated Contact with Patient 10/04/22 1028     (approximate)   History   Ankle Pain (left)   HPI  Jerry Henson is a 36 y.o. male who presents with complaints of left ankle pain.  Patient reports he turned his ankle while at work.  He is able to ambulate but has to walk with a limp.  No other injuries reported.     Physical Exam   Triage Vital Signs: ED Triage Vitals  Enc Vitals Group     BP 10/04/22 1010 122/75     Pulse Rate 10/04/22 1010 (!) 57     Resp 10/04/22 1010 17     Temp 10/04/22 1010 98.6 F (37 C)     Temp src --      SpO2 10/04/22 1010 98 %     Weight 10/04/22 1011 77.1 kg (170 lb)     Height 10/04/22 1011 1.753 m (5\' 9" )     Head Circumference --      Peak Flow --      Pain Score 10/04/22 1010 8     Pain Loc --      Pain Edu? --      Excl. in GC? --     Most recent vital signs: Vitals:   10/04/22 1010  BP: 122/75  Pulse: (!) 57  Resp: 17  Temp: 98.6 F (37 C)  SpO2: 98%     General: Awake, no distress.  CV:  Good peripheral perfusion.  Resp:  Normal effort.  Abd:  No distention.  Other:  Left ankle: No significant swelling, no bony normalities palpated, warm well-perfused   ED Results / Procedures / Treatments   Labs (all labs ordered are listed, but only abnormal results are displayed) Labs Reviewed - No data to display   EKG     RADIOLOGY Ankle x-ray viewed/interpreted by me, no fracture    PROCEDURES:  Critical Care performed:   Procedures   MEDICATIONS ORDERED IN ED: Medications - No data to display   IMPRESSION / MDM / ASSESSMENT AND PLAN / ED COURSE  I reviewed the triage vital signs and the nursing notes. Patient's presentation is most consistent with acute complicated illness / injury requiring diagnostic workup.  Patient presents with ankle injury as above.  Differential includes sprain versus fracture.  X-rays  negative for fracture,  Ace wrap applied, patient is ambulating well without crutches.  NSAIDs, supportive care, outpatient follow-up as needed        FINAL CLINICAL IMPRESSION(S) / ED DIAGNOSES   Final diagnoses:  Sprain of left ankle, unspecified ligament, initial encounter     Rx / DC Orders   ED Discharge Orders          Ordered    naproxen (NAPROSYN) 500 MG tablet  2 times daily with meals        10/04/22 1202             Note:  This document was prepared using Dragon voice recognition software and may include unintentional dictation errors.   1203, MD 10/04/22 [phone]

## 2022-10-04 NOTE — ED Triage Notes (Signed)
Pt states that this morning around 8:40 he hurt his left ankle and foot. Pt states he does not know it it got caught between boxes, but it is hurting. Pt states 8/10 pain when putting pressure on it.

## 2022-11-09 ENCOUNTER — Encounter: Payer: Self-pay | Admitting: Urology

## 2022-11-09 ENCOUNTER — Ambulatory Visit: Payer: Self-pay | Admitting: Urology

## 2023-05-09 ENCOUNTER — Other Ambulatory Visit: Payer: Self-pay

## 2023-05-09 MED ORDER — CYCLOBENZAPRINE HCL 5 MG PO TABS
5.0000 mg | ORAL_TABLET | Freq: Three times a day (TID) | ORAL | 0 refills | Status: AC
  Filled 2023-05-09: qty 15, 5d supply, fill #0

## 2023-05-09 MED ORDER — METHYLPREDNISOLONE 4 MG PO TBPK
ORAL_TABLET | ORAL | 0 refills | Status: AC
  Filled 2023-05-09: qty 21, 6d supply, fill #0

## 2023-05-11 ENCOUNTER — Ambulatory Visit: Payer: BC Managed Care – PPO | Attending: Physician Assistant

## 2023-05-11 DIAGNOSIS — M533 Sacrococcygeal disorders, not elsewhere classified: Secondary | ICD-10-CM

## 2023-05-11 DIAGNOSIS — R262 Difficulty in walking, not elsewhere classified: Secondary | ICD-10-CM

## 2023-05-11 DIAGNOSIS — M6281 Muscle weakness (generalized): Secondary | ICD-10-CM | POA: Diagnosis present

## 2023-05-11 DIAGNOSIS — M5459 Other low back pain: Secondary | ICD-10-CM | POA: Diagnosis present

## 2023-05-11 NOTE — Therapy (Addendum)
OUTPATIENT PHYSICAL THERAPY THORACOLUMBAR EVALUATION   Patient Name: Jerry Henson MRN: [id number] DOB:[birth year], 37 y.o., male Today's Date: 05/11/2023  END OF SESSION:  PT End of Session - 05/11/23 1240     Visit Number 1    Number of Visits 13    Date for PT Re-Evaluation 06/22/23    PT Start Time 1116    PT Stop Time 1205    PT Time Calculation (min) 49 min    Activity Tolerance Patient limited by pain    Behavior During Therapy [facility] for tasks assessed/performed             Past Medical History:  Diagnosis Date   Migraines    Past Surgical History:  Procedure Laterality Date   VASECTOMY     Patient Active Problem List   Diagnosis Date Noted   Epididymitis 07/16/2018    PCP: N/A  REFERRING PROVIDER: Beverly Milch PA  REFERRING DIAG: Lumbar Pain  Rationale for Evaluation and Treatment: Rehabilitation  THERAPY DIAG:  Other low back pain  Sacrococcygeal disorders, not elsewhere classified  Difficulty in walking, not elsewhere classified  Muscle weakness (generalized)  ONSET DATE: 05/09/23  SUBJECTIVE:                                                                                                                                                                                           SUBJECTIVE STATEMENT: Pt is a 37 y.o. male referred to OPPT for acute LBP.  PERTINENT HISTORY:  Pt is a 37 y.o. male referred to OPPT for acute LBP. Reports couldn't get out of bed after cross fit work out the prior day (Tuesday 05/09/23). Required significant other to help him out of bed. Reports doing dead lifts, back squats, wall walks, "GHD's" on that monday. Felt popping into L hip during his GHD's but no concordant symptoms with regards to his acute back pain. Pt was able to get MD appointment at [facility] leading to referral. Pain is central, radiates from his lower back at L5-S1 bilaterally into his groin R > L. Denies Numbness or tingling into LE's or  penis/testicles, denies B/B changes. Had X-ray but does not have the results. Pt reports mild improvements since Tuesday and is now able to walk but with a significant R sided antalgic gait. Pain described as a squeezing cramping and tightness. R side > L. Worst pain 10/10 NPS, best/average pain 7/10 NPS, current 7/10 NPS. Bending over, transfers from sitting to standing and standing to sitting, rolling in bed as aggravating. Unable to sleep with regular tossing and turning to try to find a comfortable spot. 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 Has to do heavy lifting.   PAIN:  Are you having pain? Yes: NPRS scale: 7/10 Pain location: Central and R sided LBP into R groin Pain description: aching, cramping, tightness Aggravating factors: transfers, walking, sleeping Relieving factors: N/A  PRECAUTIONS: None  RED FLAGS: None and Cauda equina syndrome: No   WEIGHT BEARING RESTRICTIONS: No  FALLS:  Has patient fallen in last 6 months? No  LIVING ENVIRONMENT: Lives with: lives with their family   OCCUPATION: truck delivery driver for roofing equipment. Requires regular heavy lifting.  PLOF: Independent  PATIENT GOALS: Improve pain, get better, be able to work.   NEXT MD VISIT: May 19, 2023  OBJECTIVE:   DIAGNOSTIC FINDINGS:  X-ray at [facility]. Unable to see results.   PATIENT SURVEYS:  FOTO 33/69  SCREENING FOR RED FLAGS: Bowel or bladder incontinence: No Spinal tumors: No Cauda equina syndrome: No Compression fracture: No Abdominal aneurysm: No  COGNITION: Overall cognitive status: Within functional limits for tasks assessed and anxious due to acute injury.      SENSATION: Light touch: WFL  DTR: Patellar: 2+ bilat  Achilles: 2+ bilat   POSTURE: right pelvic obliquity and weight shift left in standing  PALPATION: TTP along R SIJ and into R sided paraspinals L2-S1 leading to  referral to R groin. Some mild TTP along R upper glut med and max near iliac crest. TTP at R sided pubic symphysis at origin of pectineus or adductor brevis with referring pain to R lower back.  LUMBAR ROM:   AROM eval  Flexion 20%*  Extension 25%*  Right lateral flexion 75%*  Left lateral flexion 75%*  Right rotation 100%  Left rotation 100%   (Blank rows = not tested)  LOWER EXTREMITY ROM:     Active  Right eval Left eval  Hip flexion    Hip extension    Hip abduction    Hip adduction    Hip internal rotation    Hip external rotation    Knee flexion    Knee extension    Ankle dorsiflexion    Ankle plantarflexion    Ankle inversion    Ankle eversion     (Blank rows = not tested)  LOWER EXTREMITY MMT:    MMT Right eval Left eval  Hip flexion 3+ 3+  Hip extension 3 3  Hip abduction 4* 4  Hip adduction 4* 4  Hip internal rotation    Hip external rotation    Knee flexion 5 5  Knee extension 4 4  Ankle dorsiflexion 5 5  Ankle plantarflexion 5 5  Ankle inversion    Ankle eversion     (Blank rows = not tested)  LUMBAR SPECIAL TESTS:  Slump test: Negative, SI Compression/distraction test: Positive, FABER test: Positive, Gaenslen's test: Positive, and Gaenslens, Thight thrust, sacral thrust all positive   FADDIR: positive  JOINT MOBILITY: Lumbar: L1-L5 CPA's and UPA's concordant pain and hypomobile. Thoracic: T8-T12 CPA's normal and painless Sacral nutation: concordant pain  Sacral counter nutation: no pain   FUNCTIONAL TESTS:  Squat: relies on BUE support to squat with significant pain. Mild L weight shift. Unable to attempt squat without UE support.   GAIT: Distance walked: 10'  Assistive device utilized: None Level of assistance: Complete Independence Comments: R sided antalgic gait   TODAY'S TREATMENT:  DATE: 05/11/23    Provided HEP. Discussed heat versus ice modality in setting of acute inflammation.   PATIENT EDUCATION:  Education details: POC, prognosis, HEP, proper use of ice pack Person educated: Patient and Spouse Education method: Explanation, Actor cues, Verbal cues, and Handouts Education comprehension: verbalized understanding  HOME EXERCISE PROGRAM: Access Code: [email or web address] URL: [email or web address] Date: 05/11/2023 Prepared by: Ronnie Derby  Exercises - Supine Gluteal Sets  - 1 x daily - 7 x weekly - 3 sets - 8 reps - 10 hold - Supine Transversus Abdominis Bracing - Hands on Stomach  - 1 x daily - 7 x weekly - 3 sets - 8 reps - 10 hold  ASSESSMENT:  CLINICAL IMPRESSION: Patient is a 37 y.o. male who was seen today for physical therapy evaluation and treatment for acute LBP. Pt presents with negative lumbar radicular testing (slump, lower quarter neuro screen) but positive 5/5 cluster testing for SIJ dysfunction with likely SIJ joint sprain. Possible R groin strain may be involved as pt presents with painful hip adduction and hip abduction (at adductors) produces concordant symptoms. Pt with acute but symmetric LE weakness due to pain proximal > distal. Pt with significant lumbar AROM limitations due to pain also with extreme difficulty to complete sit to stand and stand to sit transfers without significant need for single to BUE support. 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 Pt will benefit from skilled PT services to address these impairments to return to PLOF.   OBJECTIVE IMPAIRMENTS: Abnormal gait, decreased mobility, difficulty walking, decreased ROM, decreased strength, hypomobility, impaired flexibility, postural dysfunction, and pain.   ACTIVITY LIMITATIONS: lifting, bending, sitting, standing, squatting, sleeping, transfers, bed  mobility, and locomotion level  PARTICIPATION LIMITATIONS: driving, community activity, occupation, and yard work  PERSONAL FACTORS: Age, Fitness, Profession, and Time since onset of injury/illness/exacerbation are also affecting patient's functional outcome.   REHAB POTENTIAL: Good  CLINICAL DECISION MAKING: Stable/uncomplicated  EVALUATION COMPLEXITY: Low   GOALS: Goals reviewed with patient? No  SHORT TERM GOALS: Target date: 06/01/23  Pt will be independent with HEP to improve pain and AROM.  Baseline:05/11/23: Initiated HEP Goal status: INITIAL  LONG TERM GOALS: Target date: 06/22/23  Pt will improve FOTO to target score to demonstrate clinically significant improvement in functional mobility.  Baseline: 05/11/23: 33 with target score of 69 Goal status: INITIAL  2.  Pt will report worst pain as a 3/10 NPS or less with functional activities to demonstrate clinically significant improvement in pain.  Baseline: 05/11/23: Averaging worst pain 7/10 NPS Goal status: INITIAL  3.  Pt will demonstrate pain free squat and dead lifts with 35# barbell to demonstrate return to recreational weight lifting activities. Baseline: 05/11/23: Requires BUE support to squat and unable to dead lift currently.  Goal status: INITIAL PLAN:  PT FREQUENCY: 1-2x/week  PT DURATION: 6 weeks  PLANNED INTERVENTIONS: Therapeutic exercises, Therapeutic activity, Neuromuscular re-education, Gait training, Patient/Family education, Self Care, Joint mobilization, Joint manipulation, Stair training, Dry Needling, Electrical stimulation, Spinal manipulation, Spinal mobilization, Cryotherapy, Moist heat, Traction, Manual therapy, and Re-evaluation.  PLAN FOR NEXT SESSION: Pain modulating techniques, modalities PRN, manual interventions. Gentle glut and core stabilization.   Delphia Grates. Fairly IV, PT, DPT Physical Therapist- [place]  [facility]  05/11/2023, 12:44 PM

## 2023-05-15 ENCOUNTER — Ambulatory Visit: Payer: BC Managed Care – PPO

## 2023-05-17 ENCOUNTER — Ambulatory Visit: Payer: BC Managed Care – PPO

## 2023-05-24 ENCOUNTER — Ambulatory Visit: Payer: Self-pay | Attending: Physician Assistant

## 2023-05-30 ENCOUNTER — Ambulatory Visit: Payer: Self-pay

## 2023-06-01 ENCOUNTER — Ambulatory Visit: Payer: Self-pay

## 2023-08-17 ENCOUNTER — Other Ambulatory Visit: Payer: Self-pay

## 2023-08-17 ENCOUNTER — Emergency Department
Admission: EM | Admit: 2023-08-17 | Discharge: 2023-08-17 | Disposition: A | Discharge status: Home / Self Care | Payer: Self-pay | Attending: Emergency Medicine | Admitting: Emergency Medicine

## 2023-08-17 ENCOUNTER — Telehealth: Payer: Self-pay | Admitting: Emergency Medicine

## 2023-08-17 ENCOUNTER — Encounter: Payer: Self-pay | Admitting: Medical Oncology

## 2023-08-17 ENCOUNTER — Emergency Department: Payer: Self-pay

## 2023-08-17 DIAGNOSIS — N50811 Right testicular pain: Secondary | ICD-10-CM

## 2023-08-17 DIAGNOSIS — N491 Inflammatory disorders of spermatic cord, tunica vaginalis and vas deferens: Secondary | ICD-10-CM | POA: Insufficient documentation

## 2023-08-17 LAB — URINALYSIS, ROUTINE W REFLEX MICROSCOPIC
Bilirubin Urine: NEGATIVE
Glucose, UA: NEGATIVE mg/dL
Hgb urine dipstick: NEGATIVE
Ketones, ur: NEGATIVE mg/dL
Leukocytes,Ua: NEGATIVE
Nitrite: NEGATIVE
Protein, ur: NEGATIVE mg/dL
Specific Gravity, Urine: 1.02 (ref 1.005–1.030)
pH: 7 (ref 5.0–8.0)

## 2023-08-17 MED ORDER — PREDNISONE 20 MG PO TABS
60.0000 mg | ORAL_TABLET | Freq: Once | ORAL | Status: AC
  Administered 2023-08-17: 60 mg via ORAL
  Filled 2023-08-17: qty 3

## 2023-08-17 MED ORDER — PREDNISONE 50 MG PO TABS
50.0000 mg | ORAL_TABLET | Freq: Every day | ORAL | 0 refills | Status: AC
  Filled 2023-08-17: qty 4, 4d supply, fill #0

## 2023-08-17 MED ORDER — CEPHALEXIN 500 MG PO CAPS
500.0000 mg | ORAL_CAPSULE | Freq: Two times a day (BID) | ORAL | 0 refills | Status: DC
  Filled 2023-08-17: qty 14, 7d supply, fill #0

## 2023-08-17 NOTE — ED Provider Notes (Signed)
   [facility] Provider Note    Event Date/Time   First MD Initiated Contact with Patient 08/17/23 1148     (approximate)   History   Testicle Pain   HPI  Jerry Henson is a 37 y.o. male who presents with complaints of right scrotal pain which developed last night.  He reports this has been intermittently happening every few months since having a vasectomy couple years ago.     Physical Exam   Triage Vital Signs: ED Triage Vitals [08/17/23 1033]  Encounter Vitals Group     BP 124/85     Systolic BP Percentile      Diastolic BP Percentile      Pulse Rate 71     Resp 16     Temp 99 F (37.2 C)     Temp Source Oral     SpO2 99 %     Weight 81.6 kg (180 lb)     Height 1.753 m (5\' 9" )     Head Circumference      Peak Flow      Pain Score 10     Pain Loc      Pain Education      Exclude from Growth Chart     Most recent vital signs: Vitals:   08/17/23 1033  BP: 124/85  Pulse: 71  Resp: 16  Temp: 99 F (37.2 C)  SpO2: 99%     General: Awake, no distress.  CV:  Good peripheral perfusion.  Resp:  Normal effort.  Abd:  No distention.  Other:  GU: Mild tenderness right inferior testicle, no erythema, no penile discharge   ED Results / Procedures / Treatments   Labs (all labs ordered are listed, but only abnormal results are displayed) Labs Reviewed  URINALYSIS, ROUTINE W REFLEX MICROSCOPIC - Abnormal; Notable for the following components:      Result Value   Color, Urine YELLOW (*)    APPearance CLOUDY (*)    All other components within normal limits     EKG     RADIOLOGY  Ultrasound suspicious for epididymitis   PROCEDURES:  Critical Care performed:   Procedures   MEDICATIONS ORDERED IN ED: Medications  predniSONE (DELTASONE) tablet 60 mg (60 mg Oral Given 08/17/23 1240)     IMPRESSION / MDM / ASSESSMENT AND PLAN / ED COURSE  I reviewed the triage vital signs and the nursing notes. Patient's  presentation is most consistent with acute complicated illness / injury requiring diagnostic workup.  Patient presents with scrotal pain as detailed above with symptoms as above, suspicious for epididymitis versus vasitis given his description.  Ultrasound most consistent with epididymitis, no evidence of UTI, no evidence of STD.  Will treat supportively, follow-up with urology      FINAL CLINICAL IMPRESSION(S) / ED DIAGNOSES   Final diagnoses:  Vasitis     Rx / DC Orders   ED Discharge Orders     None        Note:  This document was prepared using Dragon voice recognition software and may include unintentional dictation errors.   Jene Every, MD 08/17/23 1501

## 2023-08-17 NOTE — ED Triage Notes (Signed)
Pt reports right sided testicular pain that began yesterday, this has been ongoing issue pt reports since having vasectomy in 2018.

## 2023-08-17 NOTE — Telephone Encounter (Signed)
Wrong pharmacy

## 2023-08-22 ENCOUNTER — Other Ambulatory Visit: Payer: Self-pay

## 2023-08-22 ENCOUNTER — Ambulatory Visit (INDEPENDENT_AMBULATORY_CARE_PROVIDER_SITE_OTHER): Payer: Self-pay | Admitting: Physician Assistant

## 2023-08-22 ENCOUNTER — Encounter: Payer: Self-pay | Admitting: Physician Assistant

## 2023-08-22 VITALS — BP 152/81 | HR 66

## 2023-08-22 DIAGNOSIS — N50811 Right testicular pain: Secondary | ICD-10-CM

## 2023-08-22 LAB — URINALYSIS, COMPLETE
Bilirubin, UA: NEGATIVE
Glucose, UA: NEGATIVE
Ketones, UA: NEGATIVE
Leukocytes,UA: NEGATIVE
Nitrite, UA: NEGATIVE
Protein,UA: NEGATIVE
RBC, UA: NEGATIVE
Specific Gravity, UA: 1.025 (ref 1.005–1.030)
Urobilinogen, Ur: 0.2 mg/dL (ref 0.2–1.0)
pH, UA: 6 (ref 5.0–7.5)

## 2023-08-22 LAB — MICROSCOPIC EXAMINATION

## 2023-08-22 MED ORDER — AMITRIPTYLINE HCL 25 MG PO TABS
ORAL_TABLET | ORAL | 0 refills | Status: AC
  Filled 2023-08-22: qty 86, 90d supply, fill #0

## 2023-08-22 MED ORDER — SULFAMETHOXAZOLE-TRIMETHOPRIM 800-160 MG PO TABS
1.0000 | ORAL_TABLET | Freq: Two times a day (BID) | ORAL | 0 refills | Status: AC
  Filled 2023-08-22: qty 14, 7d supply, fill #0

## 2023-08-22 MED ORDER — CELECOXIB 200 MG PO CAPS
200.0000 mg | ORAL_CAPSULE | Freq: Two times a day (BID) | ORAL | 1 refills | Status: AC
  Filled 2023-08-22: qty 14, 7d supply, fill #0

## 2023-08-22 NOTE — Patient Instructions (Signed)
Scrotal support instructions:  Wear compressive underwear, e.g. jockstrap or snug boxer briefs, to keep the scrotum supported and promote drainage of swelling. Elevate the scrotum when at rest. Tuck a rolled washcloth or towel underneath the scrotum to lift it up and promote drainage back into your abdomen. Use ice as needed for pain relief. Never apply ice to the scrotum for longer than 20 minutes at a time and always keep a layer of fabric between the ice and the skin. Take Celebrex anti-inflammatory at the first sign of pain or inflammation.

## 2023-08-22 NOTE — Progress Notes (Signed)
08/22/2023 10:51 AM   Jerry Henson 21-Nov-1985 [id number]  CC: Chief Complaint  Patient presents with   Follow-up   HPI: Jerry Henson is a 37 y.o. male with PMH intermittent alternating scrotal pain after undergoing vasectomy in 2018 who presents today for evaluation of testicular swelling.   He was seen in the ED 5 days ago with reports of right-sided testicular pain.  Ultrasound was negative for mass or torsion.  Heterogeneous and hypervascular epididymides noted, R>L, consistent with acute epididymitis.  He was treated with 1 dose of p.o. prednisone 60 mg and started on Keflex.  UA was bland.  Today he reports slight improvement in pain. He is very bothered by his frequent pain episodes, which occur about every 2 months. They can occur suddenly. He is interested in pursuing epididymectomy at this point due to frustration with the frequency of these episodes.  In-office UA and microscopy today pan negative.  He denies recent anal sex.  PMH: Past Medical History:  Diagnosis Date   Migraines     Surgical History: Past Surgical History:  Procedure Laterality Date   VASECTOMY      Home Medications:  Allergies as of 08/22/2023       Reactions   Pepto-bismol [bismuth Subsalicylate] Nausea And Vomiting        Medication List        Accurate as of August 22, 2023 10:51 AM. If you have any questions, ask your nurse or doctor.          cephALEXin 500 MG capsule Commonly known as: KEFLEX Take 1 capsule (500 mg total) by mouth 2 (two) times daily.   cyclobenzaprine 5 MG tablet Commonly known as: FLEXERIL Take 1 tablet (5 mg total) by mouth 2 (two) to 3 (three) times daily as needed.   methylPREDNISolone 4 MG Tbpk tablet Commonly known as: Medrol Take as instructed for 6 days   naproxen 500 MG tablet Commonly known as: Naprosyn Take 1 tablet (500 mg total) by mouth 2 (two) times daily with a meal.   predniSONE 50 MG tablet Commonly known as:  DELTASONE Take 1 tablet (50 mg total) by mouth daily with breakfast.   sulfamethoxazole-trimethoprim 800-160 MG tablet Commonly known as: BACTRIM DS Take 1 tablet by mouth 2 (two) times daily.        Allergies:  Allergies  Allergen Reactions   Pepto-Bismol [Bismuth Subsalicylate] Nausea And Vomiting    Family History: Family History  Problem Relation Age of Onset   Prostate cancer Neg Hx    Bladder Cancer Neg Hx    Kidney cancer Neg Hx     Social History:   reports that he has been smoking cigarettes. He has been exposed to tobacco smoke. His smokeless tobacco use includes chew. He reports current alcohol use of about 2.0 standard drinks of alcohol per week. He reports that he does not use drugs.  Physical Exam: BP (!) 152/81   Pulse 66   Constitutional:  Alert and oriented, no acute distress, nontoxic appearing HEENT: [place], AT Cardiovascular: No clubbing, cyanosis, or edema Respiratory: Normal respiratory effort, no increased work of breathing GU: Bilateral descended testicles.  Enlarged and tender right epididymis. Skin: No rashes, bruises or suspicious lesions Neurologic: Grossly intact, no focal deficits, moving all 4 extremities Psychiatric: Normal mood and affect  Laboratory Data: Results for orders placed or performed in visit on 08/22/23  Microscopic Examination   Urine  Result Value Ref Range   WBC, UA 0-5  0 - 5 /hpf   RBC, Urine 0-2 0 - 2 /hpf   Epithelial Cells (non renal) 0-10 0 - 10 /hpf   Crystals Present (A) N/A   Crystal Type Amorphous Sediment N/A   Bacteria, UA Few None seen/Few  Urinalysis, Complete  Result Value Ref Range   Specific Gravity, UA 1.025 1.005 - 1.030   pH, UA 6.0 5.0 - 7.5   Color, UA Yellow Yellow   Appearance Ur Clear Clear   Leukocytes,UA Negative Negative   Protein,UA Negative Negative/Trace   Glucose, UA Negative Negative   Ketones, UA Negative Negative   RBC, UA Negative Negative   Bilirubin, UA Negative Negative    Urobilinogen, Ur 0.2 0.2 - 1.0 mg/dL   Nitrite, UA Negative Negative   Microscopic Examination See below:    Assessment & Plan:   1. 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  Will switch antibiotic to Bactrim for greater tissue penetration and prescribed 7 days of Celebrex for anti-inflammatory.  We discussed that his recurrent epididymitis could be bacterial or inflammatory in etiology, and benign UA does not rule out bacterial infection.  We discussed an action plan for if and when his symptoms recur, including scrotal support, elevation, cryotherapy, and anti-inflammatories.  I am also going to start him on amitriptyline today in the hope that this will reduce his frequency of episodes.  I am going to have him follow-up with Dr. Richardo Hanks in about 3 months for symptom recheck to see if he was able to go that long without an episode.  We discussed that epididymectomy's are not often performed for pain control reasons, but he can discuss this further with Dr. Richardo Hanks when he sees him. - Urinalysis, Complete - CULTURE, URINE COMPREHENSIVE - sulfamethoxazole-trimethoprim (BACTRIM DS) 800-160 MG tablet; Take 1 tablet by mouth 2 (two) times daily.  Dispense: 14 tablet; Refill: 0 - celecoxib (CELEBREX) 200 MG capsule; Take 1 capsule (200 mg total) by mouth 2 (two) times daily for 7 days.  Dispense: 14 capsule; Refill: 1 - amitriptyline (ELAVIL) 25 MG tablet; Take 0.5 tablets (12.5 mg total) by mouth at bedtime for 8 days, THEN 1 tablet (25 mg total) at bedtime.  Dispense: 86 tablet; Refill: 0   Return in about 3 months (around 11/22/2023) for Symptom recheck with Dr. Richardo Hanks.  Carman Ching, PA-C  [facility] [place] [address], [address] [place], [place] [address] [phone]

## 2023-08-25 LAB — CULTURE, URINE COMPREHENSIVE

## 2023-09-19 ENCOUNTER — Other Ambulatory Visit: Payer: Self-pay

## 2023-09-19 MED ORDER — AMOXICILLIN 500 MG PO CAPS
500.0000 mg | ORAL_CAPSULE | Freq: Three times a day (TID) | ORAL | 0 refills | Status: AC
  Filled 2023-09-19: qty 21, 7d supply, fill #0

## 2023-09-19 MED ORDER — IBUPROFEN 600 MG PO TABS
600.0000 mg | ORAL_TABLET | Freq: Four times a day (QID) | ORAL | 0 refills | Status: AC | PRN
  Filled 2023-09-19: qty 30, 8d supply, fill #0

## 2023-10-05 ENCOUNTER — Other Ambulatory Visit: Payer: Self-pay

## 2023-10-27 ENCOUNTER — Encounter: Payer: Self-pay | Admitting: Urology

## 2023-11-23 ENCOUNTER — Ambulatory Visit: Payer: Self-pay | Admitting: Urology

## 2024-01-11 ENCOUNTER — Encounter: Payer: Self-pay | Admitting: Urology

## 2024-01-11 ENCOUNTER — Ambulatory Visit: Payer: Self-pay | Admitting: Urology

## 2024-02-19 ENCOUNTER — Other Ambulatory Visit: Payer: Self-pay

## 2024-02-19 MED ORDER — AMOXICILLIN-POT CLAVULANATE 875-125 MG PO TABS
1.0000 | ORAL_TABLET | Freq: Two times a day (BID) | ORAL | 0 refills | Status: AC
  Filled 2024-02-19: qty 20, 10d supply, fill #0

## 2024-02-19 MED ORDER — PREDNISONE 20 MG PO TABS
40.0000 mg | ORAL_TABLET | Freq: Every day | ORAL | 0 refills | Status: AC
  Filled 2024-02-19: qty 10, 5d supply, fill #0

## 2024-07-31 ENCOUNTER — Other Ambulatory Visit: Payer: Self-pay

## 2024-07-31 MED ORDER — ONDANSETRON 4 MG PO TBDP
4.0000 mg | ORAL_TABLET | Freq: Three times a day (TID) | ORAL | 0 refills | Status: AC | PRN
  Filled 2024-07-31: qty 20, 4d supply, fill #0

## 2024-08-20 ENCOUNTER — Encounter: Payer: Self-pay | Admitting: Physician Assistant

## 2024-12-19 ENCOUNTER — Ambulatory Visit: Payer: Self-pay | Admitting: Family
